# Patient Record
Sex: Female | Born: 1935 | Race: White | Hispanic: No | Smoking: Never smoker
Health system: Southern US, Community
[De-identification: ages and names within clinical notes are randomized; demographics above are authoritative.]

## PROBLEM LIST (undated history)

## (undated) DIAGNOSIS — E782 Mixed hyperlipidemia: Secondary | ICD-10-CM

## (undated) DIAGNOSIS — I10 Essential (primary) hypertension: Secondary | ICD-10-CM

## (undated) DIAGNOSIS — K862 Cyst of pancreas: Secondary | ICD-10-CM

## (undated) DIAGNOSIS — C4490 Unspecified malignant neoplasm of skin, unspecified: Secondary | ICD-10-CM

## (undated) DIAGNOSIS — J069 Acute upper respiratory infection, unspecified: Secondary | ICD-10-CM

## (undated) HISTORY — DX: Essential (primary) hypertension: I10

## (undated) HISTORY — DX: Mixed hyperlipidemia: E78.2

## (undated) HISTORY — DX: Acute upper respiratory infection, unspecified: J06.9

## (undated) HISTORY — DX: Unspecified malignant neoplasm of skin, unspecified: C44.90

## (undated) HISTORY — DX: Cyst of pancreas: K86.2

## (undated) HISTORY — PX: CHOLECYSTECTOMY: SHX55

## (undated) HISTORY — PX: SHOULDER SURGERY: SHX246

---

## 2015-05-26 DIAGNOSIS — J329 Chronic sinusitis, unspecified: Secondary | ICD-10-CM | POA: Diagnosis not present

## 2015-05-26 DIAGNOSIS — Z789 Other specified health status: Secondary | ICD-10-CM | POA: Diagnosis not present

## 2015-05-26 DIAGNOSIS — Z418 Encounter for other procedures for purposes other than remedying health state: Secondary | ICD-10-CM | POA: Diagnosis not present

## 2015-06-09 DIAGNOSIS — Z418 Encounter for other procedures for purposes other than remedying health state: Secondary | ICD-10-CM | POA: Diagnosis not present

## 2015-06-09 DIAGNOSIS — J329 Chronic sinusitis, unspecified: Secondary | ICD-10-CM | POA: Diagnosis not present

## 2015-06-09 DIAGNOSIS — Z789 Other specified health status: Secondary | ICD-10-CM | POA: Diagnosis not present

## 2015-08-10 DIAGNOSIS — H2513 Age-related nuclear cataract, bilateral: Secondary | ICD-10-CM | POA: Diagnosis not present

## 2015-08-10 DIAGNOSIS — H538 Other visual disturbances: Secondary | ICD-10-CM | POA: Diagnosis not present

## 2015-09-22 DIAGNOSIS — K2970 Gastritis, unspecified, without bleeding: Secondary | ICD-10-CM | POA: Diagnosis not present

## 2015-09-22 DIAGNOSIS — Z299 Encounter for prophylactic measures, unspecified: Secondary | ICD-10-CM | POA: Diagnosis not present

## 2015-11-11 DIAGNOSIS — R1011 Right upper quadrant pain: Secondary | ICD-10-CM | POA: Diagnosis not present

## 2015-11-11 DIAGNOSIS — Z299 Encounter for prophylactic measures, unspecified: Secondary | ICD-10-CM | POA: Diagnosis not present

## 2015-11-15 DIAGNOSIS — R1011 Right upper quadrant pain: Secondary | ICD-10-CM | POA: Diagnosis not present

## 2015-11-15 DIAGNOSIS — K8020 Calculus of gallbladder without cholecystitis without obstruction: Secondary | ICD-10-CM | POA: Diagnosis not present

## 2015-11-15 DIAGNOSIS — N289 Disorder of kidney and ureter, unspecified: Secondary | ICD-10-CM | POA: Diagnosis not present

## 2015-11-15 DIAGNOSIS — K760 Fatty (change of) liver, not elsewhere classified: Secondary | ICD-10-CM | POA: Diagnosis not present

## 2015-11-22 DIAGNOSIS — K8020 Calculus of gallbladder without cholecystitis without obstruction: Secondary | ICD-10-CM | POA: Diagnosis not present

## 2015-11-22 DIAGNOSIS — K862 Cyst of pancreas: Secondary | ICD-10-CM | POA: Diagnosis not present

## 2015-11-22 DIAGNOSIS — Z713 Dietary counseling and surveillance: Secondary | ICD-10-CM | POA: Diagnosis not present

## 2015-11-22 DIAGNOSIS — Z6825 Body mass index (BMI) 25.0-25.9, adult: Secondary | ICD-10-CM | POA: Diagnosis not present

## 2015-11-25 DIAGNOSIS — Z01812 Encounter for preprocedural laboratory examination: Secondary | ICD-10-CM | POA: Diagnosis not present

## 2015-11-25 DIAGNOSIS — R1011 Right upper quadrant pain: Secondary | ICD-10-CM | POA: Diagnosis not present

## 2015-11-26 DIAGNOSIS — R590 Localized enlarged lymph nodes: Secondary | ICD-10-CM | POA: Diagnosis not present

## 2015-11-26 DIAGNOSIS — K760 Fatty (change of) liver, not elsewhere classified: Secondary | ICD-10-CM | POA: Diagnosis not present

## 2015-11-26 DIAGNOSIS — Q453 Other congenital malformations of pancreas and pancreatic duct: Secondary | ICD-10-CM | POA: Diagnosis not present

## 2015-11-26 DIAGNOSIS — R935 Abnormal findings on diagnostic imaging of other abdominal regions, including retroperitoneum: Secondary | ICD-10-CM | POA: Diagnosis not present

## 2015-11-26 DIAGNOSIS — K8020 Calculus of gallbladder without cholecystitis without obstruction: Secondary | ICD-10-CM | POA: Diagnosis not present

## 2015-12-08 DIAGNOSIS — K8050 Calculus of bile duct without cholangitis or cholecystitis without obstruction: Secondary | ICD-10-CM | POA: Diagnosis not present

## 2015-12-09 DIAGNOSIS — K8050 Calculus of bile duct without cholangitis or cholecystitis without obstruction: Secondary | ICD-10-CM | POA: Diagnosis not present

## 2015-12-09 DIAGNOSIS — K8010 Calculus of gallbladder with chronic cholecystitis without obstruction: Secondary | ICD-10-CM | POA: Diagnosis not present

## 2016-03-07 DIAGNOSIS — Z23 Encounter for immunization: Secondary | ICD-10-CM | POA: Diagnosis not present

## 2016-03-31 DIAGNOSIS — Z79899 Other long term (current) drug therapy: Secondary | ICD-10-CM | POA: Diagnosis not present

## 2016-03-31 DIAGNOSIS — Z7189 Other specified counseling: Secondary | ICD-10-CM | POA: Diagnosis not present

## 2016-03-31 DIAGNOSIS — Z1389 Encounter for screening for other disorder: Secondary | ICD-10-CM | POA: Diagnosis not present

## 2016-03-31 DIAGNOSIS — Z299 Encounter for prophylactic measures, unspecified: Secondary | ICD-10-CM | POA: Diagnosis not present

## 2016-03-31 DIAGNOSIS — E559 Vitamin D deficiency, unspecified: Secondary | ICD-10-CM | POA: Diagnosis not present

## 2016-03-31 DIAGNOSIS — R5383 Other fatigue: Secondary | ICD-10-CM | POA: Diagnosis not present

## 2016-03-31 DIAGNOSIS — Z1211 Encounter for screening for malignant neoplasm of colon: Secondary | ICD-10-CM | POA: Diagnosis not present

## 2016-03-31 DIAGNOSIS — Z0000 Encounter for general adult medical examination without abnormal findings: Secondary | ICD-10-CM | POA: Diagnosis not present

## 2016-03-31 DIAGNOSIS — Z6825 Body mass index (BMI) 25.0-25.9, adult: Secondary | ICD-10-CM | POA: Diagnosis not present

## 2016-03-31 DIAGNOSIS — E7800 Pure hypercholesterolemia, unspecified: Secondary | ICD-10-CM | POA: Diagnosis not present

## 2017-01-03 DIAGNOSIS — E559 Vitamin D deficiency, unspecified: Secondary | ICD-10-CM | POA: Diagnosis not present

## 2017-01-03 DIAGNOSIS — Z6824 Body mass index (BMI) 24.0-24.9, adult: Secondary | ICD-10-CM | POA: Diagnosis not present

## 2017-01-03 DIAGNOSIS — J019 Acute sinusitis, unspecified: Secondary | ICD-10-CM | POA: Diagnosis not present

## 2017-01-03 DIAGNOSIS — Z789 Other specified health status: Secondary | ICD-10-CM | POA: Diagnosis not present

## 2017-01-03 DIAGNOSIS — Z299 Encounter for prophylactic measures, unspecified: Secondary | ICD-10-CM | POA: Diagnosis not present

## 2017-02-14 DIAGNOSIS — Z23 Encounter for immunization: Secondary | ICD-10-CM | POA: Diagnosis not present

## 2017-03-13 DIAGNOSIS — E663 Overweight: Secondary | ICD-10-CM | POA: Diagnosis not present

## 2017-03-13 DIAGNOSIS — E559 Vitamin D deficiency, unspecified: Secondary | ICD-10-CM | POA: Diagnosis not present

## 2017-03-13 DIAGNOSIS — Z789 Other specified health status: Secondary | ICD-10-CM | POA: Diagnosis not present

## 2017-03-13 DIAGNOSIS — Z299 Encounter for prophylactic measures, unspecified: Secondary | ICD-10-CM | POA: Diagnosis not present

## 2017-03-13 DIAGNOSIS — J069 Acute upper respiratory infection, unspecified: Secondary | ICD-10-CM | POA: Diagnosis not present

## 2017-04-04 DIAGNOSIS — Z5320 Procedure and treatment not carried out because of patient's decision for unspecified reasons: Secondary | ICD-10-CM | POA: Diagnosis not present

## 2017-04-04 DIAGNOSIS — Z1331 Encounter for screening for depression: Secondary | ICD-10-CM | POA: Diagnosis not present

## 2017-04-04 DIAGNOSIS — Z7189 Other specified counseling: Secondary | ICD-10-CM | POA: Diagnosis not present

## 2017-04-04 DIAGNOSIS — Z1211 Encounter for screening for malignant neoplasm of colon: Secondary | ICD-10-CM | POA: Diagnosis not present

## 2017-04-04 DIAGNOSIS — Z1339 Encounter for screening examination for other mental health and behavioral disorders: Secondary | ICD-10-CM | POA: Diagnosis not present

## 2017-04-04 DIAGNOSIS — Z0000 Encounter for general adult medical examination without abnormal findings: Secondary | ICD-10-CM | POA: Diagnosis not present

## 2017-04-04 DIAGNOSIS — Z299 Encounter for prophylactic measures, unspecified: Secondary | ICD-10-CM | POA: Diagnosis not present

## 2017-04-04 DIAGNOSIS — Z6825 Body mass index (BMI) 25.0-25.9, adult: Secondary | ICD-10-CM | POA: Diagnosis not present

## 2017-04-04 DIAGNOSIS — E559 Vitamin D deficiency, unspecified: Secondary | ICD-10-CM | POA: Diagnosis not present

## 2017-04-04 DIAGNOSIS — R5383 Other fatigue: Secondary | ICD-10-CM | POA: Diagnosis not present

## 2017-04-25 DIAGNOSIS — Z299 Encounter for prophylactic measures, unspecified: Secondary | ICD-10-CM | POA: Diagnosis not present

## 2017-04-25 DIAGNOSIS — R030 Elevated blood-pressure reading, without diagnosis of hypertension: Secondary | ICD-10-CM | POA: Diagnosis not present

## 2017-04-25 DIAGNOSIS — Z713 Dietary counseling and surveillance: Secondary | ICD-10-CM | POA: Diagnosis not present

## 2017-04-25 DIAGNOSIS — Z6825 Body mass index (BMI) 25.0-25.9, adult: Secondary | ICD-10-CM | POA: Diagnosis not present

## 2017-05-04 DIAGNOSIS — Z789 Other specified health status: Secondary | ICD-10-CM | POA: Diagnosis not present

## 2017-05-04 DIAGNOSIS — R030 Elevated blood-pressure reading, without diagnosis of hypertension: Secondary | ICD-10-CM | POA: Diagnosis not present

## 2017-05-04 DIAGNOSIS — Z713 Dietary counseling and surveillance: Secondary | ICD-10-CM | POA: Diagnosis not present

## 2017-05-04 DIAGNOSIS — Z299 Encounter for prophylactic measures, unspecified: Secondary | ICD-10-CM | POA: Diagnosis not present

## 2017-05-04 DIAGNOSIS — Z6825 Body mass index (BMI) 25.0-25.9, adult: Secondary | ICD-10-CM | POA: Diagnosis not present

## 2017-05-22 DIAGNOSIS — H33321 Round hole, right eye: Secondary | ICD-10-CM | POA: Diagnosis not present

## 2017-05-22 DIAGNOSIS — H5213 Myopia, bilateral: Secondary | ICD-10-CM | POA: Diagnosis not present

## 2017-05-22 DIAGNOSIS — H25813 Combined forms of age-related cataract, bilateral: Secondary | ICD-10-CM | POA: Diagnosis not present

## 2017-05-22 DIAGNOSIS — H52221 Regular astigmatism, right eye: Secondary | ICD-10-CM | POA: Diagnosis not present

## 2017-07-18 DIAGNOSIS — M1990 Unspecified osteoarthritis, unspecified site: Secondary | ICD-10-CM | POA: Diagnosis not present

## 2017-07-18 DIAGNOSIS — R509 Fever, unspecified: Secondary | ICD-10-CM | POA: Diagnosis not present

## 2017-07-18 DIAGNOSIS — R197 Diarrhea, unspecified: Secondary | ICD-10-CM | POA: Diagnosis not present

## 2017-07-18 DIAGNOSIS — J069 Acute upper respiratory infection, unspecified: Secondary | ICD-10-CM | POA: Diagnosis not present

## 2017-07-18 DIAGNOSIS — Z299 Encounter for prophylactic measures, unspecified: Secondary | ICD-10-CM | POA: Diagnosis not present

## 2017-07-18 DIAGNOSIS — J111 Influenza due to unidentified influenza virus with other respiratory manifestations: Secondary | ICD-10-CM | POA: Diagnosis not present

## 2017-07-18 DIAGNOSIS — R112 Nausea with vomiting, unspecified: Secondary | ICD-10-CM | POA: Diagnosis not present

## 2017-07-18 DIAGNOSIS — Z789 Other specified health status: Secondary | ICD-10-CM | POA: Diagnosis not present

## 2017-07-18 DIAGNOSIS — Z6825 Body mass index (BMI) 25.0-25.9, adult: Secondary | ICD-10-CM | POA: Diagnosis not present

## 2017-07-18 DIAGNOSIS — R0602 Shortness of breath: Secondary | ICD-10-CM | POA: Diagnosis not present

## 2017-09-18 DIAGNOSIS — Z789 Other specified health status: Secondary | ICD-10-CM | POA: Diagnosis not present

## 2017-09-18 DIAGNOSIS — Z713 Dietary counseling and surveillance: Secondary | ICD-10-CM | POA: Diagnosis not present

## 2017-09-18 DIAGNOSIS — Z6825 Body mass index (BMI) 25.0-25.9, adult: Secondary | ICD-10-CM | POA: Diagnosis not present

## 2017-09-18 DIAGNOSIS — J329 Chronic sinusitis, unspecified: Secondary | ICD-10-CM | POA: Diagnosis not present

## 2017-09-18 DIAGNOSIS — Z299 Encounter for prophylactic measures, unspecified: Secondary | ICD-10-CM | POA: Diagnosis not present

## 2017-12-03 DIAGNOSIS — Z299 Encounter for prophylactic measures, unspecified: Secondary | ICD-10-CM | POA: Diagnosis not present

## 2017-12-03 DIAGNOSIS — Z713 Dietary counseling and surveillance: Secondary | ICD-10-CM | POA: Diagnosis not present

## 2017-12-03 DIAGNOSIS — B351 Tinea unguium: Secondary | ICD-10-CM | POA: Diagnosis not present

## 2017-12-03 DIAGNOSIS — Z6825 Body mass index (BMI) 25.0-25.9, adult: Secondary | ICD-10-CM | POA: Diagnosis not present

## 2018-02-21 DIAGNOSIS — Z299 Encounter for prophylactic measures, unspecified: Secondary | ICD-10-CM | POA: Diagnosis not present

## 2018-02-21 DIAGNOSIS — Z713 Dietary counseling and surveillance: Secondary | ICD-10-CM | POA: Diagnosis not present

## 2018-02-21 DIAGNOSIS — J309 Allergic rhinitis, unspecified: Secondary | ICD-10-CM | POA: Diagnosis not present

## 2018-02-21 DIAGNOSIS — Z6826 Body mass index (BMI) 26.0-26.9, adult: Secondary | ICD-10-CM | POA: Diagnosis not present

## 2018-02-21 DIAGNOSIS — J069 Acute upper respiratory infection, unspecified: Secondary | ICD-10-CM | POA: Diagnosis not present

## 2018-02-25 DIAGNOSIS — Z23 Encounter for immunization: Secondary | ICD-10-CM | POA: Diagnosis not present

## 2018-04-10 DIAGNOSIS — Z6826 Body mass index (BMI) 26.0-26.9, adult: Secondary | ICD-10-CM | POA: Diagnosis not present

## 2018-04-10 DIAGNOSIS — R5383 Other fatigue: Secondary | ICD-10-CM | POA: Diagnosis not present

## 2018-04-10 DIAGNOSIS — Z0000 Encounter for general adult medical examination without abnormal findings: Secondary | ICD-10-CM | POA: Diagnosis not present

## 2018-04-10 DIAGNOSIS — E559 Vitamin D deficiency, unspecified: Secondary | ICD-10-CM | POA: Diagnosis not present

## 2018-04-10 DIAGNOSIS — Z299 Encounter for prophylactic measures, unspecified: Secondary | ICD-10-CM | POA: Diagnosis not present

## 2018-04-10 DIAGNOSIS — Z7189 Other specified counseling: Secondary | ICD-10-CM | POA: Diagnosis not present

## 2018-04-10 DIAGNOSIS — Z1331 Encounter for screening for depression: Secondary | ICD-10-CM | POA: Diagnosis not present

## 2018-04-10 DIAGNOSIS — Z1339 Encounter for screening examination for other mental health and behavioral disorders: Secondary | ICD-10-CM | POA: Diagnosis not present

## 2018-04-10 DIAGNOSIS — Z1211 Encounter for screening for malignant neoplasm of colon: Secondary | ICD-10-CM | POA: Diagnosis not present

## 2018-04-10 DIAGNOSIS — Z79899 Other long term (current) drug therapy: Secondary | ICD-10-CM | POA: Diagnosis not present

## 2019-02-13 DIAGNOSIS — Z23 Encounter for immunization: Secondary | ICD-10-CM | POA: Diagnosis not present

## 2019-03-17 DIAGNOSIS — H2513 Age-related nuclear cataract, bilateral: Secondary | ICD-10-CM | POA: Diagnosis not present

## 2019-03-17 DIAGNOSIS — H524 Presbyopia: Secondary | ICD-10-CM | POA: Diagnosis not present

## 2019-03-17 DIAGNOSIS — H52223 Regular astigmatism, bilateral: Secondary | ICD-10-CM | POA: Diagnosis not present

## 2019-03-17 DIAGNOSIS — H5213 Myopia, bilateral: Secondary | ICD-10-CM | POA: Diagnosis not present

## 2019-06-04 DIAGNOSIS — Z23 Encounter for immunization: Secondary | ICD-10-CM | POA: Diagnosis not present

## 2019-07-02 DIAGNOSIS — Z23 Encounter for immunization: Secondary | ICD-10-CM | POA: Diagnosis not present

## 2019-08-20 DIAGNOSIS — Z1211 Encounter for screening for malignant neoplasm of colon: Secondary | ICD-10-CM | POA: Diagnosis not present

## 2019-08-20 DIAGNOSIS — R5383 Other fatigue: Secondary | ICD-10-CM | POA: Diagnosis not present

## 2019-08-20 DIAGNOSIS — Z1331 Encounter for screening for depression: Secondary | ICD-10-CM | POA: Diagnosis not present

## 2019-08-20 DIAGNOSIS — Z7189 Other specified counseling: Secondary | ICD-10-CM | POA: Diagnosis not present

## 2019-08-20 DIAGNOSIS — Z1339 Encounter for screening examination for other mental health and behavioral disorders: Secondary | ICD-10-CM | POA: Diagnosis not present

## 2019-08-20 DIAGNOSIS — E559 Vitamin D deficiency, unspecified: Secondary | ICD-10-CM | POA: Diagnosis not present

## 2019-08-20 DIAGNOSIS — Z6828 Body mass index (BMI) 28.0-28.9, adult: Secondary | ICD-10-CM | POA: Diagnosis not present

## 2019-08-20 DIAGNOSIS — Z79899 Other long term (current) drug therapy: Secondary | ICD-10-CM | POA: Diagnosis not present

## 2019-08-20 DIAGNOSIS — Z0000 Encounter for general adult medical examination without abnormal findings: Secondary | ICD-10-CM | POA: Diagnosis not present

## 2019-08-20 DIAGNOSIS — Z299 Encounter for prophylactic measures, unspecified: Secondary | ICD-10-CM | POA: Diagnosis not present

## 2020-02-23 DIAGNOSIS — Z23 Encounter for immunization: Secondary | ICD-10-CM | POA: Diagnosis not present

## 2020-03-10 DIAGNOSIS — Z23 Encounter for immunization: Secondary | ICD-10-CM | POA: Diagnosis not present

## 2020-08-24 DIAGNOSIS — Z1331 Encounter for screening for depression: Secondary | ICD-10-CM | POA: Diagnosis not present

## 2020-08-24 DIAGNOSIS — Z1339 Encounter for screening examination for other mental health and behavioral disorders: Secondary | ICD-10-CM | POA: Diagnosis not present

## 2020-08-24 DIAGNOSIS — Z7189 Other specified counseling: Secondary | ICD-10-CM | POA: Diagnosis not present

## 2020-08-24 DIAGNOSIS — Z299 Encounter for prophylactic measures, unspecified: Secondary | ICD-10-CM | POA: Diagnosis not present

## 2020-08-24 DIAGNOSIS — Z79899 Other long term (current) drug therapy: Secondary | ICD-10-CM | POA: Diagnosis not present

## 2020-08-24 DIAGNOSIS — E559 Vitamin D deficiency, unspecified: Secondary | ICD-10-CM | POA: Diagnosis not present

## 2020-08-24 DIAGNOSIS — Z6826 Body mass index (BMI) 26.0-26.9, adult: Secondary | ICD-10-CM | POA: Diagnosis not present

## 2020-08-24 DIAGNOSIS — E7800 Pure hypercholesterolemia, unspecified: Secondary | ICD-10-CM | POA: Diagnosis not present

## 2020-08-24 DIAGNOSIS — Z0000 Encounter for general adult medical examination without abnormal findings: Secondary | ICD-10-CM | POA: Diagnosis not present

## 2020-08-24 DIAGNOSIS — R5383 Other fatigue: Secondary | ICD-10-CM | POA: Diagnosis not present

## 2020-09-06 DIAGNOSIS — Z299 Encounter for prophylactic measures, unspecified: Secondary | ICD-10-CM | POA: Diagnosis not present

## 2020-09-06 DIAGNOSIS — I10 Essential (primary) hypertension: Secondary | ICD-10-CM | POA: Diagnosis not present

## 2020-09-06 DIAGNOSIS — E039 Hypothyroidism, unspecified: Secondary | ICD-10-CM | POA: Diagnosis not present

## 2020-09-10 DIAGNOSIS — Z299 Encounter for prophylactic measures, unspecified: Secondary | ICD-10-CM | POA: Diagnosis not present

## 2020-09-10 DIAGNOSIS — J329 Chronic sinusitis, unspecified: Secondary | ICD-10-CM | POA: Diagnosis not present

## 2020-09-10 DIAGNOSIS — J310 Chronic rhinitis: Secondary | ICD-10-CM | POA: Diagnosis not present

## 2020-09-10 DIAGNOSIS — I10 Essential (primary) hypertension: Secondary | ICD-10-CM | POA: Diagnosis not present

## 2020-09-10 DIAGNOSIS — Z789 Other specified health status: Secondary | ICD-10-CM | POA: Diagnosis not present

## 2020-09-16 DIAGNOSIS — Z23 Encounter for immunization: Secondary | ICD-10-CM | POA: Diagnosis not present

## 2020-09-22 DIAGNOSIS — Z299 Encounter for prophylactic measures, unspecified: Secondary | ICD-10-CM | POA: Diagnosis not present

## 2020-09-22 DIAGNOSIS — I10 Essential (primary) hypertension: Secondary | ICD-10-CM | POA: Diagnosis not present

## 2020-09-22 DIAGNOSIS — E039 Hypothyroidism, unspecified: Secondary | ICD-10-CM | POA: Diagnosis not present

## 2020-10-12 DIAGNOSIS — I10 Essential (primary) hypertension: Secondary | ICD-10-CM | POA: Diagnosis not present

## 2020-10-20 DIAGNOSIS — E039 Hypothyroidism, unspecified: Secondary | ICD-10-CM | POA: Diagnosis not present

## 2020-10-26 DIAGNOSIS — Z789 Other specified health status: Secondary | ICD-10-CM | POA: Diagnosis not present

## 2020-10-26 DIAGNOSIS — Z299 Encounter for prophylactic measures, unspecified: Secondary | ICD-10-CM | POA: Diagnosis not present

## 2020-10-26 DIAGNOSIS — E039 Hypothyroidism, unspecified: Secondary | ICD-10-CM | POA: Diagnosis not present

## 2020-10-26 DIAGNOSIS — I10 Essential (primary) hypertension: Secondary | ICD-10-CM | POA: Diagnosis not present

## 2020-11-11 DIAGNOSIS — I10 Essential (primary) hypertension: Secondary | ICD-10-CM | POA: Diagnosis not present

## 2020-11-18 DIAGNOSIS — H524 Presbyopia: Secondary | ICD-10-CM | POA: Diagnosis not present

## 2020-11-18 DIAGNOSIS — H52223 Regular astigmatism, bilateral: Secondary | ICD-10-CM | POA: Diagnosis not present

## 2020-11-18 DIAGNOSIS — H25813 Combined forms of age-related cataract, bilateral: Secondary | ICD-10-CM | POA: Diagnosis not present

## 2020-11-18 DIAGNOSIS — D3131 Benign neoplasm of right choroid: Secondary | ICD-10-CM | POA: Diagnosis not present

## 2020-11-18 DIAGNOSIS — H5213 Myopia, bilateral: Secondary | ICD-10-CM | POA: Diagnosis not present

## 2020-11-23 DIAGNOSIS — E039 Hypothyroidism, unspecified: Secondary | ICD-10-CM | POA: Diagnosis not present

## 2020-11-23 DIAGNOSIS — Z789 Other specified health status: Secondary | ICD-10-CM | POA: Diagnosis not present

## 2020-11-23 DIAGNOSIS — I10 Essential (primary) hypertension: Secondary | ICD-10-CM | POA: Diagnosis not present

## 2020-11-23 DIAGNOSIS — Z299 Encounter for prophylactic measures, unspecified: Secondary | ICD-10-CM | POA: Diagnosis not present

## 2020-12-10 DIAGNOSIS — I10 Essential (primary) hypertension: Secondary | ICD-10-CM | POA: Diagnosis not present

## 2020-12-13 DIAGNOSIS — D485 Neoplasm of uncertain behavior of skin: Secondary | ICD-10-CM | POA: Diagnosis not present

## 2020-12-13 DIAGNOSIS — Z299 Encounter for prophylactic measures, unspecified: Secondary | ICD-10-CM | POA: Diagnosis not present

## 2020-12-13 DIAGNOSIS — C44321 Squamous cell carcinoma of skin of nose: Secondary | ICD-10-CM | POA: Diagnosis not present

## 2021-01-10 DIAGNOSIS — C44329 Squamous cell carcinoma of skin of other parts of face: Secondary | ICD-10-CM | POA: Diagnosis not present

## 2021-01-12 DIAGNOSIS — I10 Essential (primary) hypertension: Secondary | ICD-10-CM | POA: Diagnosis not present

## 2021-02-03 DIAGNOSIS — Z23 Encounter for immunization: Secondary | ICD-10-CM | POA: Diagnosis not present

## 2021-02-11 DIAGNOSIS — I10 Essential (primary) hypertension: Secondary | ICD-10-CM | POA: Diagnosis not present

## 2021-02-23 DIAGNOSIS — Z23 Encounter for immunization: Secondary | ICD-10-CM | POA: Diagnosis not present

## 2021-03-14 DIAGNOSIS — I10 Essential (primary) hypertension: Secondary | ICD-10-CM | POA: Diagnosis not present

## 2021-03-17 DIAGNOSIS — E039 Hypothyroidism, unspecified: Secondary | ICD-10-CM | POA: Diagnosis not present

## 2021-03-17 DIAGNOSIS — Z2821 Immunization not carried out because of patient refusal: Secondary | ICD-10-CM | POA: Diagnosis not present

## 2021-03-17 DIAGNOSIS — I10 Essential (primary) hypertension: Secondary | ICD-10-CM | POA: Diagnosis not present

## 2021-03-17 DIAGNOSIS — Z6825 Body mass index (BMI) 25.0-25.9, adult: Secondary | ICD-10-CM | POA: Diagnosis not present

## 2021-03-17 DIAGNOSIS — Z299 Encounter for prophylactic measures, unspecified: Secondary | ICD-10-CM | POA: Diagnosis not present

## 2021-04-13 DIAGNOSIS — I10 Essential (primary) hypertension: Secondary | ICD-10-CM | POA: Diagnosis not present

## 2021-08-16 ENCOUNTER — Emergency Department (HOSPITAL_COMMUNITY): Payer: Medicare Other

## 2021-08-16 ENCOUNTER — Observation Stay (HOSPITAL_COMMUNITY)
Admission: EM | Admit: 2021-08-16 | Discharge: 2021-08-17 | Disposition: A | Discharge status: Home / Self Care | Payer: Medicare Other | Attending: Family Medicine | Admitting: Family Medicine

## 2021-08-16 ENCOUNTER — Encounter (HOSPITAL_COMMUNITY): Payer: Self-pay

## 2021-08-16 ENCOUNTER — Other Ambulatory Visit: Payer: Self-pay

## 2021-08-16 DIAGNOSIS — R55 Syncope and collapse: Principal | ICD-10-CM | POA: Diagnosis present

## 2021-08-16 DIAGNOSIS — Z20822 Contact with and (suspected) exposure to covid-19: Secondary | ICD-10-CM | POA: Diagnosis not present

## 2021-08-16 DIAGNOSIS — R4182 Altered mental status, unspecified: Secondary | ICD-10-CM | POA: Diagnosis not present

## 2021-08-16 DIAGNOSIS — G4489 Other headache syndrome: Secondary | ICD-10-CM | POA: Diagnosis not present

## 2021-08-16 DIAGNOSIS — I10 Essential (primary) hypertension: Secondary | ICD-10-CM | POA: Diagnosis not present

## 2021-08-16 DIAGNOSIS — E039 Hypothyroidism, unspecified: Secondary | ICD-10-CM | POA: Diagnosis not present

## 2021-08-16 DIAGNOSIS — R4020 Unspecified coma: Secondary | ICD-10-CM | POA: Diagnosis not present

## 2021-08-16 DIAGNOSIS — R197 Diarrhea, unspecified: Secondary | ICD-10-CM | POA: Diagnosis not present

## 2021-08-16 HISTORY — DX: Essential (primary) hypertension: I10

## 2021-08-16 LAB — RESP PANEL BY RT-PCR (FLU A&B, COVID) ARPGX2
Influenza A by PCR: NEGATIVE
Influenza B by PCR: NEGATIVE
SARS Coronavirus 2 by RT PCR: NEGATIVE

## 2021-08-16 LAB — COMPREHENSIVE METABOLIC PANEL
ALT: 15 U/L (ref 0–44)
AST: 23 U/L (ref 15–41)
Albumin: 3.9 g/dL (ref 3.5–5.0)
Alkaline Phosphatase: 55 U/L (ref 38–126)
Anion gap: 8 (ref 5–15)
BUN: 22 mg/dL (ref 8–23)
CO2: 25 mmol/L (ref 22–32)
Calcium: 8.6 mg/dL — ABNORMAL LOW (ref 8.9–10.3)
Chloride: 102 mmol/L (ref 98–111)
Creatinine, Ser: 0.84 mg/dL (ref 0.44–1.00)
GFR, Estimated: >60 mL/min (ref >60)
Glucose, Bld: 123 mg/dL — ABNORMAL HIGH (ref 70–99)
Potassium: 4.3 mmol/L (ref 3.5–5.1)
Sodium: 135 mmol/L (ref 135–145)
Total Bilirubin: 0.8 mg/dL (ref 0.3–1.2)
Total Protein: 7.5 g/dL (ref 6.5–8.1)

## 2021-08-16 LAB — TROPONIN I (HIGH SENSITIVITY)
Troponin I (High Sensitivity): 4 ng/L (ref <18)
Troponin I (High Sensitivity): 5 ng/L (ref <18)

## 2021-08-16 LAB — CBC WITH DIFFERENTIAL/PLATELET
Abs Immature Granulocytes: 0.03 10*3/uL (ref 0.00–0.07)
Basophils Absolute: 0 10*3/uL (ref 0.0–0.1)
Basophils Relative: 0 %
Eosinophils Absolute: 0 10*3/uL (ref 0.0–0.5)
Eosinophils Relative: 0 %
HCT: 40.6 % (ref 36.0–46.0)
Hemoglobin: 13.2 g/dL (ref 12.0–15.0)
Immature Granulocytes: 0 %
Lymphocytes Relative: 5 %
Lymphs Abs: 0.6 10*3/uL — ABNORMAL LOW (ref 0.7–4.0)
MCH: 29.3 pg (ref 26.0–34.0)
MCHC: 32.5 g/dL (ref 30.0–36.0)
MCV: 90.2 fL (ref 80.0–100.0)
Monocytes Absolute: 0.3 10*3/uL (ref 0.1–1.0)
Monocytes Relative: 3 %
Neutro Abs: 9.8 10*3/uL — ABNORMAL HIGH (ref 1.7–7.7)
Neutrophils Relative %: 92 %
Platelets: 191 10*3/uL (ref 150–400)
RBC: 4.5 MIL/uL (ref 3.87–5.11)
RDW: 12.8 % (ref 11.5–15.5)
WBC: 10.7 10*3/uL — ABNORMAL HIGH (ref 4.0–10.5)
nRBC: 0 % (ref 0.0–0.2)

## 2021-08-16 LAB — MAGNESIUM: Magnesium: 1.9 mg/dL (ref 1.7–2.4)

## 2021-08-16 LAB — TSH: TSH: 0.861 u[IU]/mL (ref 0.350–4.500)

## 2021-08-16 LAB — LIPASE, BLOOD: Lipase: 38 U/L (ref 11–51)

## 2021-08-16 MED ORDER — ENOXAPARIN SODIUM 40 MG/0.4ML IJ SOSY
40.0000 mg | PREFILLED_SYRINGE | INTRAMUSCULAR | Status: DC
  Administered 2021-08-16: 40 mg via SUBCUTANEOUS
  Filled 2021-08-16: qty 0.4

## 2021-08-16 MED ORDER — ACETAMINOPHEN 650 MG RE SUPP: 650.0000 mg | Freq: Four times a day (QID) | RECTAL | Status: DC | PRN

## 2021-08-16 MED ORDER — ACETAMINOPHEN 325 MG PO TABS: 650.0000 mg | ORAL_TABLET | Freq: Four times a day (QID) | ORAL | Status: DC | PRN

## 2021-08-16 MED ORDER — POLYETHYLENE GLYCOL 3350 17 G PO PACK: 17.0000 g | PACK | Freq: Every day | ORAL | Status: DC | PRN

## 2021-08-16 MED ORDER — SODIUM CHLORIDE 0.9 % IV BOLUS
500.0000 mL | Freq: Once | INTRAVENOUS | Status: AC
  Administered 2021-08-16: 500 mL via INTRAVENOUS

## 2021-08-16 MED ORDER — SODIUM CHLORIDE 0.9 % IV SOLN: INTRAVENOUS | Status: DC

## 2021-08-16 NOTE — ED Provider Notes (Signed)
? ?Emergency Department Provider Note ? ? ?I have reviewed the triage vital signs and the nursing notes. ? ? ?HISTORY ? ?Chief Complaint ?Weakness ? ? ?HPI ?Alejandra Jimenez is a 87 y.o. female with PMH of HTN presents to the emergency room for evaluation of syncope.  Patient states that she is feeling well today and playing cards with friends when she suddenly developed tinnitus.  States she was not feeling well so she went to her bedroom to sit down but shortly afterward woke up face down on the floor.  She does not recall having chest pain, palpitations, shortness of breath.  She notes that she had had diarrhea on herself along with some vomiting.  She was found by staff and ultimately EMS called.  She states she now feeling well.  She is not having any residual symptoms.  No new medications. No prior episodes of syncope.  ? ? ?Past Medical History:  ?Diagnosis Date  ? Hypertension   ? ? ?Review of Systems ? ?Constitutional: No fever/chills ?Eyes: No visual changes. ?ENT: No sore throat. Tinnitus (resolved).  ?Cardiovascular: Denies chest pain. Positive syncope.  ?Respiratory: Denies shortness of breath. ?Gastrointestinal: No abdominal pain.  No nausea, no vomiting.  No diarrhea.  No constipation. ?Genitourinary: Negative for dysuria. ?Musculoskeletal: Negative for back pain. ?Skin: Negative for rash. ?Neurological: Negative for headaches, focal weakness or numbness. ? ? ?____________________________________________ ? ? ?PHYSICAL EXAM: ? ?VITAL SIGNS: ?ED Triage Vitals  ?Enc Vitals Group  ?   BP 08/16/21 1617 (!) 151/80  ?   Pulse Rate 08/16/21 1617 85  ?   Resp 08/16/21 1617 18  ?   Temp 08/16/21 1617 97.8 ?F (36.6 ?C)  ?   Temp Source 08/16/21 1617 Oral  ?   SpO2 08/16/21 1617 92 %  ?   Weight 08/16/21 1612 152 lb (68.9 kg)  ?   Height 08/16/21 1612 '5\' 8"'$  (1.727 m)  ? ?Constitutional: Alert and oriented. Well appearing and in no acute distress. ?Eyes: Conjunctivae are normal.  ?Head: Atraumatic. ?Nose: No  congestion/rhinnorhea. ?Mouth/Throat: Mucous membranes are moist.  Oropharynx non-erythematous. ?Neck: No stridor.   ?Cardiovascular: Normal rate, regular rhythm. Good peripheral circulation. Grossly normal heart sounds.   ?Respiratory: Normal respiratory effort.  No retractions. Lungs CTAB. ?Gastrointestinal: Soft and nontender. No distention.  ?Musculoskeletal: No lower extremity tenderness nor edema. No gross deformities of extremities. ?Neurologic:  Normal speech and language. No gross focal neurologic deficits are appreciated.  ?Skin:  Skin is warm, dry and intact. No rash noted. ? ? ?____________________________________________ ?  ?LABS ?(all labs ordered are listed, but only abnormal results are displayed) ? ?Labs Reviewed  ?COMPREHENSIVE METABOLIC PANEL - Abnormal; Notable for the following components:  ?    Result Value  ? Glucose, Bld 123 (*)   ? Calcium 8.6 (*)   ? All other components within normal limits  ?CBC WITH DIFFERENTIAL/PLATELET - Abnormal; Notable for the following components:  ? WBC 10.7 (*)   ? Neutro Abs 9.8 (*)   ? Lymphs Abs 0.6 (*)   ? All other components within normal limits  ?RESP PANEL BY RT-PCR (FLU A&B, COVID) [id number]  ?LIPASE, BLOOD  ?TSH  ?TROPONIN I (HIGH SENSITIVITY)  ?TROPONIN I (HIGH SENSITIVITY)  ? ?____________________________________________ ? ?EKG ? ?Triage EKG:  ? ?Rate: 90 ?PR: 148 ?QTc: 442 ? ?Sinus rhythm. Narrow QRS. Nonsecific ST changes. No STEMI.  ?____________________________________________ ? ?RADIOLOGY ? ?DG Chest 2 View ? ?Result Date: 08/16/2021 ?CLINICAL DATA:  Syncope EXAM: CHEST -  2 VIEW COMPARISON:  July 18, 2017 FINDINGS: The heart size and mediastinal contours are within normal limits. Aortic atherosclerosis. Biapical pleural thickening similar prior. Chronic senescent lung changes. No focal airspace consolidation or overt pulmonary edema. No pleural effusion. No pneumothorax. No acute osseous abnormality. IMPRESSION: 1. No acute cardiopulmonary findings.  2.  Aortic Atherosclerosis (ICD10-I70.0). Electronically Signed   By: Dahlia Bailiff M.D.   On: 08/16/2021 17:59  ? ?CT HEAD WO CONTRAST (5MM) ? ?Result Date: 08/16/2021 ?CLINICAL DATA:  Mental status change. EXAM: CT HEAD WITHOUT CONTRAST TECHNIQUE: Contiguous axial images were obtained from the base of the skull through the vertex without intravenous contrast. RADIATION DOSE REDUCTION: This exam was performed according to the departmental dose-optimization program which includes automated exposure control, adjustment of the mA and/or kV according to patient size and/or use of iterative reconstruction technique. COMPARISON:  None. FINDINGS: Brain: No acute intracranial hemorrhage. No focal mass lesion. No CT evidence of acute infarction. No midline shift or mass effect. No hydrocephalus. Basilar cisterns are patent. There are mild periventricular and subcortical white matter hypodensities. Generalized cortical atrophy. Vascular: No hyperdense vessel or unexpected calcification. Skull: Normal. Negative for fracture or focal lesion. Sinuses/Orbits: Paranasal sinuses and mastoid air cells are clear. Orbits are clear. Other: None. IMPRESSION: 1. No acute intracranial findings. 2. Mild generalized atrophy and white matter microvascular disease. Electronically Signed   By: Suzy Bouchard M.D.   On: 08/16/2021 18:21   ? ?____________________________________________ ? ? ?PROCEDURES ? ?Procedure(s) performed:  ? ?Procedures ? ?None  ?____________________________________________ ? ? ?INITIAL IMPRESSION / ASSESSMENT AND PLAN / ED COURSE ? ?Pertinent labs & imaging results that were available during my care of the patient were reviewed by me and considered in my medical decision making (see chart for details). ?  ?This patient is Presenting for Evaluation of syncope, which does require a range of treatment options, and is a complaint that involves a high risk of morbidity and mortality. ? ?The Differential Diagnoses include  vasovagal syncope, cardiogenic syncope, dehydration, seizure. ? ?Critical Interventions-  ?  ?Medications  ?sodium chloride 0.9 % bolus 500 mL (0 mLs Intravenous Stopped 08/16/21 2041)  ? ? ?Reassessment after intervention: Patient continues to feel well.  ? ? ?I did obtain Additional Historical Information from EMS. No vital sign abnormality en route.  ? ?I decided to review pertinent External Data, and in summary no prior ED evaluation for syncope. ?  ?Clinical Laboratory Tests Ordered, included COVID and flu PCR negative.  Troponin is 5.  TSH is within normal limits.  No acute kidney injury.  Mild leukocytosis at 10.7 with no anemia.  Lipase is normal. ? ?Radiologic Tests Ordered, included CT head and CXR. I independently interpreted the images and agree with radiology interpretation.  ? ?Cardiac Monitor Tracing which shows NSR. No ectopy.  ? ? ?Social Determinants of Health Risk No EtOH or smoking.  ? ?Consult complete with Hospitalist. Plan for admit.  ? ?Medical Decision Making: Summary:  ?Patient presents to the emergency department with syncope.  No clear prodrome other than tinnitus.  No active tinnitus or other neuro findings to suspect stroke.  No stigmata of seizure.  Imaging and chest x-ray interpreted as above.  No prior episodes similar to this.  Plan for admit for further monitoring and syncope evaluation. ? ?Reevaluation with update and discussion with patient and grandson by phone. They are in agreement with plan for admit.  ? ?Disposition: admit ? ?____________________________________________ ? ?FINAL CLINICAL IMPRESSION(S) / ED DIAGNOSES ? ?  Final diagnoses:  ?Syncope, unspecified syncope type  ? ? ? ? ?Note:  This document was prepared using Dragon voice recognition software and may include unintentional dictation errors. ? ?Nanda Quinton, MD, FACEP ?Emergency Medicine ? ?  ?Margette Fast, MD ?08/16/21 2107 ? ?

## 2021-08-16 NOTE — Assessment & Plan Note (Addendum)
TSH 0.8-borderline low. ?- she is on synthroid- '50mg'$  daily ?

## 2021-08-16 NOTE — H&P (Signed)
?History and Physical  ? ? ?Darrick Grinder DTO:[id number] DOB: 11-27-1934 DOA: 08/16/2021 ? ?PCP: Nicanor Bake C  ? ?Patient coming from: Home ? ?I have personally briefly reviewed patient's old medical records in [place] ? ?Chief Complaint: Syncope ? ?HPI: Alejandra Jimenez is a 87 y.o. female with medical history significant for hypertension. ?Patient was brought to the ED via EMS reports that she passed out today. ?At the time of my evaluation patient is awake alert oriented and able to give a detailed history.  Patient tells me patient was playing cards with her 3 friends when she suddenly started having ringing in the ears, just prior to this she had gotten up to make a phone call, she reports feeling weird/different may be lightheaded-she is not sure.  She told her friend she was not feeling quite okay and went to her room.  She went to sit down and this was the last she remembers before waking up to see her friend standing by her . ?Shortly after she went to her bedroom, her friends heard a sound -she fell, and rushed to the bedroom.  They found her face down in her vomit, she had also had a bowel movement.  She does not think she had had urinary continues also.  Patient tells me it was less than a minute between her getting to her room and her friend finding her.  No reports of jerking of extremities. ?EMS was called.  Patient also has a wrist watch that alerts family when she falls and so her grandson who works at [employer] also called. ?Patient lives alone, she drives, she is independent of all ADLs.  She tells me her only healthcare encounters has been for gallbladder surgery and skin cancer removed from her face.  She was started on lisinopril 5 mg and perhaps Synthroid 50 mg - 08/2020. ? ?Patient denies any palpitation, she denies prior lightheadedness, no chest pain, no difficulty breathing.  She has had good oral intake, no vomiting no loose stools.  No history of seizures.  No  headaches.  She ambulates without assistance or assistive devices. ? ?ED Course: Temperature 97.8.  Heart rate 85 - 99.  Respiratory 12-27.  Blood pressure systolic 983 to 382N.  Positive orthostatic vitals with systolic dropping from 053-976-BHA patient was asymptomatic. ?Troponin 4.  Unremarkable BMP, CBC. ?500 mill bolus given.  Hospitalist to admit. ? ?Review of Systems: As per HPI all other systems reviewed and negative. ? ?Past Medical History:  ?Diagnosis Date  ? Hypertension   ? ? ?Past Surgical History:  ?Procedure Laterality Date  ? CHOLECYSTECTOMY    ? ? ? reports that she has never smoked. She has never used smokeless tobacco. She reports that she does not drink alcohol and does not use drugs. ? ?Allergies  ?Allergen Reactions  ? Tamiflu [Oseltamivir] Nausea And Vomiting  ? ? ?No family history of seizures. ? ?Prior to Admission medications   ?Not on File  ? ? ?Physical Exam: ?Vitals:  ? 08/16/21 1830 08/16/21 1930 08/16/21 2000 08/16/21 2030  ?BP: (!) 160/63 (!) 160/71 (!) 162/82 (!) 138/100  ?Pulse: 92     ?Resp: 18 (!) 27 18 (!) 23  ?Temp:      ?TempSrc:      ?SpO2: 94%     ?Weight:      ?Height:      ? ? ?Constitutional: NAD, calm, comfortable ?Vitals:  ? 08/16/21 1830 08/16/21 1930 08/16/21 2000 08/16/21 2030  ?BP: Marland Kitchen)  160/63 (!) 160/71 (!) 162/82 (!) 138/100  ?Pulse: 92     ?Resp: 18 (!) 27 18 (!) 23  ?Temp:      ?TempSrc:      ?SpO2: 94%     ?Weight:      ?Height:      ? ?Eyes: PERRL, lids and conjunctivae normal ?ENMT: Mucous membranes are mildly dry ?Neck: normal, supple, no masses, no thyromegaly ?Respiratory: clear to auscultation bilaterally, no wheezing, no crackles. Normal respiratory effort. No accessory muscle use.  ?Cardiovascular: Regular rate and rhythm, no murmurs / rubs / gallops. No extremity edema. ?Abdomen: no tenderness, no masses palpated. No hepatosplenomegaly. Bowel sounds positive.  ?Musculoskeletal: no clubbing / cyanosis. No joint deformity upper and lower extremities. Good  ROM, no contractures. Normal muscle tone.  ?Skin: no rashes, lesions, ulcers. No induration ?Neurologic: No apparent cranial nerve abnormality, moving all extremities spontaneously.  ?Psychiatric: Normal judgment and insight. Alert and oriented x 3. Normal mood.  ? ?Labs on Admission: I have personally reviewed following labs and imaging studies ? ?CBC: ?Recent Labs  ?Lab 08/16/21 ?1723  ?WBC 10.7*  ?NEUTROABS 9.8*  ?HGB 13.2  ?HCT 40.6  ?MCV 90.2  ?PLT 191  ? ?Basic Metabolic Panel: ?Recent Labs  ?Lab 08/16/21 ?1723  ?NA 135  ?K 4.3  ?CL 102  ?CO2 25  ?GLUCOSE 123*  ?BUN 22  ?CREATININE 0.84  ?CALCIUM 8.6*  ? ?GFR: ?Estimated Creatinine Clearance: 50.3 mL/min (by C-G formula based on SCr of 0.84 mg/dL). ?Liver Function Tests: ?Recent Labs  ?Lab 08/16/21 ?1723  ?AST 23  ?ALT 15  ?ALKPHOS 55  ?BILITOT 0.8  ?PROT 7.5  ?ALBUMIN 3.9  ? ?Recent Labs  ?Lab 08/16/21 ?1723  ?LIPASE 38  ? ?Thyroid Function Tests: ?Recent Labs  ?  08/16/21 ?1723  ?TSH 0.861  ? ?Anemia Panel: ?No results for input(s): VITAMINB12, FOLATE, FERRITIN, TIBC, IRON, RETICCTPCT in the last 72 hours. ?Urine analysis: ?No results found for: COLORURINE, APPEARANCEUR, [place], [place], [place], [place], [place], KETONESUR, PROTEINUR, [place], NITRITE, LEUKOCYTESUR ? ?Radiological Exams on Admission: ?DG Chest 2 View ? ?Result Date: 08/16/2021 ?CLINICAL DATA:  Syncope EXAM: CHEST - 2 VIEW COMPARISON:  July 18, 2017 FINDINGS: The heart size and mediastinal contours are within normal limits. Aortic atherosclerosis. Biapical pleural thickening similar prior. Chronic senescent lung changes. No focal airspace consolidation or overt pulmonary edema. No pleural effusion. No pneumothorax. No acute osseous abnormality. IMPRESSION: 1. No acute cardiopulmonary findings. 2.  Aortic Atherosclerosis (ICD10-I70.0). Electronically Signed   By: Dahlia Bailiff M.D.   On: 08/16/2021 17:59  ? ?CT HEAD WO CONTRAST (5MM) ? ?Result Date: 08/16/2021 ?CLINICAL DATA:  Mental  status change. EXAM: CT HEAD WITHOUT CONTRAST TECHNIQUE: Contiguous axial images were obtained from the base of the skull through the vertex without intravenous contrast. RADIATION DOSE REDUCTION: This exam was performed according to the departmental dose-optimization program which includes automated exposure control, adjustment of the mA and/or kV according to patient size and/or use of iterative reconstruction technique. COMPARISON:  None. FINDINGS: Brain: No acute intracranial hemorrhage. No focal mass lesion. No CT evidence of acute infarction. No midline shift or mass effect. No hydrocephalus. Basilar cisterns are patent. There are mild periventricular and subcortical white matter hypodensities. Generalized cortical atrophy. Vascular: No hyperdense vessel or unexpected calcification. Skull: Normal. Negative for fracture or focal lesion. Sinuses/Orbits: Paranasal sinuses and mastoid air cells are clear. Orbits are clear. Other: None. IMPRESSION: 1. No acute intracranial findings. 2. Mild generalized atrophy and white matter microvascular  disease. Electronically Signed   By: Suzy Bouchard M.D.   On: 08/16/2021 18:21   ? ?EKG: Independently reviewed.  Sinus rhythm rate 90, QTc 442.  No prior EKG to compare.  Nonspecific T wave abnormalities in lateral leads.  QTc 442. (Unable to export EKG into epic,) ? ? ? ?Assessment/Plan ?Principal Problem: ?  Syncope ?Active Problems: ?  Hypothyroidism ?  Essential hypertension ? ?Assessment and Plan: ?* Syncope ?Syncopal episode with vomiting, and fecal incontinence.  No jerking of extremities.  No history of seizures.  Healthy 87 year old female.  CT without acute abnormality.  Two-view chest x-ray unremarkable.  EKG-sinus rhythm, without significant abnormalities. ?- Troponin 4 >> 5.  ?-Echocardiogram ?-Monitor on telemetry ?-Positive orthostatic vitals, systolic dropping from 767-209.  May be etiology contributed to syncope, but with vomiting and fecal incontinence may  need further work-up ending clinical course. ?-Obtain EEG ?-500 mill bolus given, continue N/s 75cc/hr x 20hrs ? ?Essential hypertension ?Positive orthostatic vitals. ?-Hold lisinopril for now ? ?Hypothyroidi

## 2021-08-16 NOTE — Assessment & Plan Note (Signed)
Positive orthostatic vitals. ?-Hold lisinopril for now ?

## 2021-08-16 NOTE — ED Notes (Signed)
Patient transported to CT and X ray 

## 2021-08-16 NOTE — Assessment & Plan Note (Addendum)
Syncopal episode with vomiting, and fecal incontinence.  No jerking of extremities.  No history of seizures.  Healthy 87 year old female.  CT without acute abnormality.  Two-view chest x-ray unremarkable.  EKG-sinus rhythm, without significant abnormalities. ?- Troponin 4 >> 5.  ?-Echocardiogram ?-Monitor on telemetry ?-Positive orthostatic vitals, systolic dropping from 619-012.  May be etiology contributed to syncope, but with vomiting and fecal incontinence may need further work-up ending clinical course. ?-Obtain EEG ?-500 mill bolus given, continue N/s 75cc/hr x 20hrs ?

## 2021-08-16 NOTE — ED Triage Notes (Addendum)
Patient arrived via EMS with complaints of syncopal episode. Per EMS patient was soiled with stool, vomit, and urine upon awakening from syncopal episode. Per EMS patient was face down in vomit upon awakening. Patient states she was playing cards, began to have ringing in her ears, and went to her room to sit down before the syncopal episode. Denies blood thinner. Patient A/O x4 in triage process.  ?

## 2021-08-17 ENCOUNTER — Observation Stay (HOSPITAL_BASED_OUTPATIENT_CLINIC_OR_DEPARTMENT_OTHER): Payer: Medicare Other

## 2021-08-17 ENCOUNTER — Encounter (HOSPITAL_COMMUNITY): Payer: Self-pay | Admitting: Internal Medicine

## 2021-08-17 DIAGNOSIS — R55 Syncope and collapse: Secondary | ICD-10-CM | POA: Diagnosis not present

## 2021-08-17 LAB — BASIC METABOLIC PANEL
Anion gap: 7 (ref 5–15)
BUN: 20 mg/dL (ref 8–23)
CO2: 25 mmol/L (ref 22–32)
Calcium: 8.3 mg/dL — ABNORMAL LOW (ref 8.9–10.3)
Chloride: 104 mmol/L (ref 98–111)
Creatinine, Ser: 0.95 mg/dL (ref 0.44–1.00)
GFR, Estimated: 59 mL/min — ABNORMAL LOW (ref >60)
Glucose, Bld: 89 mg/dL (ref 70–99)
Potassium: 4 mmol/L (ref 3.5–5.1)
Sodium: 136 mmol/L (ref 135–145)

## 2021-08-17 LAB — ECHOCARDIOGRAM COMPLETE
AR max vel: 2.06 cm2
AV Area VTI: 2.16 cm2
AV Area mean vel: 2.06 cm2
AV Mean grad: 4 mmHg
AV Peak grad: 6.7 mmHg
Ao pk vel: 1.29 m/s
Area-P 1/2: 3.58 cm2
Calc EF: 65.1 %
Height: 68 in
MV VTI: 2.4 cm2
S' Lateral: 2.5 cm
Single Plane A2C EF: 57.8 %
Single Plane A4C EF: 69.5 %
Weight: 2416 oz

## 2021-08-17 LAB — CBC
HCT: 32.8 % — ABNORMAL LOW (ref 36.0–46.0)
Hemoglobin: 10.8 g/dL — ABNORMAL LOW (ref 12.0–15.0)
MCH: 29.3 pg (ref 26.0–34.0)
MCHC: 32.9 g/dL (ref 30.0–36.0)
MCV: 89.1 fL (ref 80.0–100.0)
Platelets: 162 10*3/uL (ref 150–400)
RBC: 3.68 MIL/uL — ABNORMAL LOW (ref 3.87–5.11)
RDW: 12.8 % (ref 11.5–15.5)
WBC: 6.4 10*3/uL (ref 4.0–10.5)
nRBC: 0 % (ref 0.0–0.2)

## 2021-08-17 LAB — HEMOGLOBIN AND HEMATOCRIT, BLOOD
HCT: 33.3 % — ABNORMAL LOW (ref 36.0–46.0)
Hemoglobin: 11.2 g/dL — ABNORMAL LOW (ref 12.0–15.0)

## 2021-08-17 MED ORDER — LISINOPRIL 5 MG PO TABS: 5.0000 mg | ORAL_TABLET | Freq: Every day | ORAL | 3 refills | Status: DC

## 2021-08-17 MED ORDER — LEVOTHYROXINE SODIUM 50 MCG PO TABS: 50.0000 ug | ORAL_TABLET | Freq: Every day | ORAL | 3 refills | Status: AC

## 2021-08-17 NOTE — Discharge Summary (Signed)
?                                                                                ? ? ?Alejandra Jimenez, is a 87 y.o. female  DOB Jan 28, 1935  MRN [id number]. ? ?Admission date:  08/16/2021  Admitting Physician  Ejiroghene Arlyce Dice, MD ? ?Discharge Date:  08/17/2021  ? ?Primary MD  Nicanor Bake C ? ?Recommendations for primary care physician for things to follow:  ? ?1) You will repeat CBC and BMP blood test with the primary care physician in about 1 week or so ?2)You will need an MRI of the brain and EEG test if you have another episode of fainting/passing out in the near future ?3) the cardiology office will set up a heart monitor/Zio patch for you--to help look for any possible irregular heartbeat that may have contributed to your passing out episode ?4) please follow-up with Primary care physician Dr Nicanor Bake C  within a week for recheck and reevaluation ? ?Admission Diagnosis  Syncope [R55] ?Syncope, unspecified syncope type [R55] ? ? ?Discharge Diagnosis  Syncope [R55] ?Syncope, unspecified syncope type [R55]   ? ?Principal Problem: ?  Syncope ?Active Problems: ?  Hypothyroidism ?  Essential hypertension ?    ? ?Past Medical History:  ?Diagnosis Date  ? Hypertension   ? ? ?Past Surgical History:  ?Procedure Laterality Date  ? CHOLECYSTECTOMY    ? ? ? ? ? HPI  from the history and physical done on the day of admission:  ? ?HPI: Alejandra Jimenez is a 87 y.o. female with medical history significant for hypertension. ?Patient was brought to the ED via EMS reports that she passed out today. ?At the time of my evaluation patient is awake alert oriented and able to give a detailed history.  Patient tells me patient was playing cards with her 3 friends when she suddenly started having ringing in the ears, just prior to this she had gotten up to make a phone call, she reports feeling weird/different may be lightheaded-she is not sure.  She told her friend she was not feeling quite okay and went to her room.  She  went to sit down and this was the last she remembers before waking up to see her friend standing by her . ?Shortly after she went to her bedroom, her friends heard a sound -she fell, and rushed to the bedroom.  They found her face down in her vomit, she had also had a bowel movement.  She does not think she had had urinary continues also.  Patient tells me it was less than a minute between her getting to her room and her friend finding her.  No reports of jerking of extremities. ?EMS was called.  Patient also has a wrist watch that alerts family when she falls and so her grandson who works at [employer] also called. ?Patient lives alone, she drives, she is independent of all ADLs.  She tells me her only healthcare encounters has been for gallbladder surgery and skin cancer removed from her face.  She was started on lisinopril 5 mg and perhaps Synthroid 50 mg - 08/2020. ?  ?Patient denies any palpitation,  she denies prior lightheadedness, no chest pain, no difficulty breathing.  She has had good oral intake, no vomiting no loose stools.  No history of seizures.  No headaches.  She ambulates without assistance or assistive devices. ?  ?ED Course: Temperature 97.8.  Heart rate 85 - 99.  Respiratory 12-27.  Blood pressure systolic 381 to 829H.  Positive orthostatic vitals with systolic dropping from 371-696-VEL patient was asymptomatic. ?Troponin 4.  Unremarkable BMP, CBC. ?500 mill bolus given.  Hospitalist to admit. ?  ?Review of Systems: As per HPI all other systems reviewed and negative. ? ? ? ? Hospital Course:  ? ? Assessment and Plan: ?* Syncope ?Syncopal episode with vomiting, and fecal incontinence.  No jerking of extremities.  No history of seizures.  Otherwise healthy 87 year old female.  CT without acute abnormality.  Two-view chest x-ray unremarkable.  EKG-sinus rhythm, without significant abnormalities. ?- Troponin 4 >> 5.  ?-Echocardiogram with EF of 65 to 70% with grade 1 diastolic dysfunction  with pulmonary hypertension, echo without aortic stenosis ?-Patient was orthostatic, received IV fluids--BP improved ?-Outpatient MRI of the brain and EEG if another syncopal episode or concerns about seizures ?-Outpatient Holter/Zio patch--to be arranged through cardiology office ? ?Essential hypertension ?-Stable BP okay to resume lisinopril 5 mg daily ?-Follow-up with PCP for recheck and reeval ? ?Hypothyroidism ?TSH 0.8-borderline low. ?- she is on synthroid- '50mg'$  daily ? ?Discharge Condition: stable ? ?Follow UP--PCP for recheck and reevaluation, patient also needs outpatient Zio patch ?  ?Diet and Activity recommendation:  As advised ? ?Discharge Instructions   ?* ?Discharge Instructions   ? ? Call MD for:  difficulty breathing, headache or visual disturbances   Complete by: As directed ?  ? Call MD for:  persistant dizziness or light-headedness   Complete by: As directed ?  ? Call MD for:  persistant nausea and vomiting   Complete by: As directed ?  ? Call MD for:  temperature >100.4   Complete by: As directed ?  ? Diet - low sodium heart healthy   Complete by: As directed ?  ? Discharge instructions   Complete by: As directed ?  ? 1) You will repeat CBC and BMP blood test with the primary care physician in about 1 week or so ?2)You will need an MRI of the brain and EEG test if you have another episode of fainting/passing out in the near future ?3) the cardiology office will set up a heart monitor/Zio patch for you--to help look for any possible irregular heartbeat that may have contributed to your passing out episode ?4) please follow-up with Primary care physician Dr Nicanor Bake C  within a week for recheck and reevaluation  ? Increase activity slowly   Complete by: As directed ?  ? ?  ? ? ? ? Discharge Medications  ? ?  ?Allergies as of 08/17/2021   ? ?   Reactions  ? Tamiflu [oseltamivir] Nausea And Vomiting  ? ?  ? ?  ?Medication List  ?  ? ?TAKE these medications   ? ?levothyroxine 50 MCG  tablet ?Commonly known as: SYNTHROID ?Take 1 tablet (50 mcg total) by mouth daily before breakfast. ?What changed: when to take this ?  ?lisinopril 5 MG tablet ?Commonly known as: ZESTRIL ?Take 1 tablet (5 mg total) by mouth daily. ?  ? ?  ? ? ?Major procedures and Radiology Reports - PLEASE review detailed and final reports for all details, in brief -  ? ?DG Chest 2  View ? ?Result Date: 08/16/2021 ?CLINICAL DATA:  Syncope EXAM: CHEST - 2 VIEW COMPARISON:  July 18, 2017 FINDINGS: The heart size and mediastinal contours are within normal limits. Aortic atherosclerosis. Biapical pleural thickening similar prior. Chronic senescent lung changes. No focal airspace consolidation or overt pulmonary edema. No pleural effusion. No pneumothorax. No acute osseous abnormality. IMPRESSION: 1. No acute cardiopulmonary findings. 2.  Aortic Atherosclerosis (ICD10-I70.0). Electronically Signed   By: Dahlia Bailiff M.D.   On: 08/16/2021 17:59  ? ?CT HEAD WO CONTRAST (5MM) ? ?Result Date: 08/16/2021 ?CLINICAL DATA:  Mental status change. EXAM: CT HEAD WITHOUT CONTRAST TECHNIQUE: Contiguous axial images were obtained from the base of the skull through the vertex without intravenous contrast. RADIATION DOSE REDUCTION: This exam was performed according to the departmental dose-optimization program which includes automated exposure control, adjustment of the mA and/or kV according to patient size and/or use of iterative reconstruction technique. COMPARISON:  None. FINDINGS: Brain: No acute intracranial hemorrhage. No focal mass lesion. No CT evidence of acute infarction. No midline shift or mass effect. No hydrocephalus. Basilar cisterns are patent. There are mild periventricular and subcortical white matter hypodensities. Generalized cortical atrophy. Vascular: No hyperdense vessel or unexpected calcification. Skull: Normal. Negative for fracture or focal lesion. Sinuses/Orbits: Paranasal sinuses and mastoid air cells are clear. Orbits are  clear. Other: None. IMPRESSION: 1. No acute intracranial findings. 2. Mild generalized atrophy and white matter microvascular disease. Electronically Signed   By: Suzy Bouchard M.D.   On: 08/16/2021 18:21  ? ?ECHO

## 2021-08-17 NOTE — Discharge Instructions (Signed)
1) You will repeat CBC and BMP blood test with the primary care physician in about 1 week or so ?2)You will need an MRI of the brain and EEG test if you have another episode of fainting/passing out in the near future ?3) the cardiology office will set up a heart monitor/Zio patch for you--to help look for any possible irregular heartbeat that may have contributed to your passing out episode ?4) please follow-up with Primary care physician Dr Nicanor Bake C  within a week for recheck and reevaluation ?

## 2021-08-17 NOTE — Progress Notes (Signed)
?  Transition of Care (TOC) Screening Note ? ? ?Patient Details  ?Name: Alejandra Jimenez ?Date of Birth: Oct 08, 1934 ? ? ?Transition of Care (TOC) CM/SW Contact:    ?Iona Beard, LCSWA ?Phone Number: ?08/17/2021, 11:15 AM ? ? ? ?Transition of Care Department [facility]) has reviewed patient and no TOC needs have been identified at this time. We will continue to monitor patient advancement through interdisciplinary progression rounds. If new patient transition needs arise, please place a TOC consult. ?  ?

## 2021-08-17 NOTE — Progress Notes (Signed)
*  PRELIMINARY RESULTS* ?Echocardiogram ?2D Echocardiogram has been performed. ? ?Alejandra Jimenez ?08/17/2021, 11:51 AM ?

## 2021-08-17 NOTE — Progress Notes (Signed)
Nsg Discharge Note ? ?Admit Date:  08/16/2021 ?Discharge date: 08/17/2021 ?  ?Darrick Grinder to be D/C'd Home per MD order.  AVS completed.  Copy for chart, and copy for patient signed, and dated. ?Patient/caregiver able to verbalize understanding. ? ?Discharge Medication: ?Allergies as of 08/17/2021   ? ?   Reactions  ? Tamiflu [oseltamivir] Nausea And Vomiting  ? ?  ? ?  ?Medication List  ?  ? ?TAKE these medications   ? ?levothyroxine 50 MCG tablet ?Commonly known as: SYNTHROID ?Take 1 tablet (50 mcg total) by mouth daily before breakfast. ?What changed: when to take this ?  ?lisinopril 5 MG tablet ?Commonly known as: ZESTRIL ?Take 1 tablet (5 mg total) by mouth daily. ?  ? ?  ? ? ?Discharge Assessment: ?Vitals:  ? 08/17/21 0630 08/17/21 1500  ?BP: (!) 123/54 (!) 125/53  ?Pulse: 81 90  ?Resp: 18 19  ?Temp: 98 ?F (36.7 ?C) 98.4 ?F (36.9 ?C)  ?SpO2: 97% 96%  ? Skin clean, dry and intact without evidence of skin break down, no evidence of skin tears noted. ?IV catheter discontinued intact. Site without signs and symptoms of complications - no redness or edema noted at insertion site, patient denies c/o pain - only slight tenderness at site.  Dressing with slight pressure applied. ? ?D/c Instructions-Education: ?Discharge instructions given to patient/family with verbalized understanding. ?D/c education completed with patient/family including follow up instructions, medication list, d/c activities limitations if indicated, with other d/c instructions as indicated by MD - patient able to verbalize understanding, all questions fully answered. ?Patient instructed to return to ED, call 911, or call MD for any changes in condition.  ?Patient escorted via [place], and D/C home via private auto. ? ?Dorcas Mcmurray, LPN ?07/15/9922 2:68 PM  ?

## 2021-08-22 DIAGNOSIS — R55 Syncope and collapse: Secondary | ICD-10-CM | POA: Diagnosis not present

## 2021-08-22 DIAGNOSIS — I10 Essential (primary) hypertension: Secondary | ICD-10-CM | POA: Diagnosis not present

## 2021-08-22 DIAGNOSIS — I700 Atherosclerosis of aorta: Secondary | ICD-10-CM | POA: Diagnosis not present

## 2021-08-22 DIAGNOSIS — Z299 Encounter for prophylactic measures, unspecified: Secondary | ICD-10-CM | POA: Diagnosis not present

## 2021-08-22 DIAGNOSIS — Z09 Encounter for follow-up examination after completed treatment for conditions other than malignant neoplasm: Secondary | ICD-10-CM | POA: Diagnosis not present

## 2021-08-31 ENCOUNTER — Other Ambulatory Visit: Payer: Self-pay | Admitting: Family Medicine

## 2021-08-31 ENCOUNTER — Ambulatory Visit: Payer: Medicare Other

## 2021-08-31 DIAGNOSIS — Z789 Other specified health status: Secondary | ICD-10-CM | POA: Diagnosis not present

## 2021-08-31 DIAGNOSIS — R55 Syncope and collapse: Secondary | ICD-10-CM

## 2021-08-31 DIAGNOSIS — R5383 Other fatigue: Secondary | ICD-10-CM | POA: Diagnosis not present

## 2021-08-31 DIAGNOSIS — E7800 Pure hypercholesterolemia, unspecified: Secondary | ICD-10-CM | POA: Diagnosis not present

## 2021-08-31 DIAGNOSIS — Z79899 Other long term (current) drug therapy: Secondary | ICD-10-CM | POA: Diagnosis not present

## 2021-08-31 DIAGNOSIS — Z0000 Encounter for general adult medical examination without abnormal findings: Secondary | ICD-10-CM | POA: Diagnosis not present

## 2021-08-31 DIAGNOSIS — E559 Vitamin D deficiency, unspecified: Secondary | ICD-10-CM | POA: Diagnosis not present

## 2021-08-31 DIAGNOSIS — E039 Hypothyroidism, unspecified: Secondary | ICD-10-CM | POA: Diagnosis not present

## 2021-08-31 DIAGNOSIS — Z6824 Body mass index (BMI) 24.0-24.9, adult: Secondary | ICD-10-CM | POA: Diagnosis not present

## 2021-08-31 DIAGNOSIS — Z1339 Encounter for screening examination for other mental health and behavioral disorders: Secondary | ICD-10-CM | POA: Diagnosis not present

## 2021-08-31 DIAGNOSIS — Z7189 Other specified counseling: Secondary | ICD-10-CM | POA: Diagnosis not present

## 2021-08-31 DIAGNOSIS — I10 Essential (primary) hypertension: Secondary | ICD-10-CM | POA: Diagnosis not present

## 2021-08-31 DIAGNOSIS — Z1331 Encounter for screening for depression: Secondary | ICD-10-CM | POA: Diagnosis not present

## 2021-08-31 DIAGNOSIS — Z299 Encounter for prophylactic measures, unspecified: Secondary | ICD-10-CM | POA: Diagnosis not present

## 2021-09-27 ENCOUNTER — Encounter: Payer: Self-pay | Admitting: Cardiology

## 2021-09-27 NOTE — Progress Notes (Signed)
? ? ?Cardiology Office Note ? ?Date: 09/28/2021  ? ?ID: Darrick Grinder, DOB April 27, 1935, MRN [id number] ? ?PCP:  Nicanor Bake C  ?Cardiologist:  Rozann Lesches, MD ?Electrophysiologist:  None  ? ?Chief Complaint  ?Patient presents with  ? History of syncope  ? ? ?History of Present Illness: ?Alejandra Jimenez is an 87 y.o. female referred for cardiology consultation by Dr. Woody Seller for evaluation of syncope.  I reviewed the available records.  She was hospitalized at [facility] in early April after an episode of apparent syncope.  Head CT reported no acute findings, she did not have any obvious arrhythmias on cardiac monitoring, high-sensitivity troponin I levels were normal.  Echocardiogram showed normal LVEF without significant valvular abnormalities.  She was found to be orthostatic and given IV fluids.  Outpatient cardiac monitor was recommended by hospitalist team. ? ?We discussed the event today.  She tells me that she was playing cards with some friends, stood up from the table to go answer the phone and when she got back to the table felt unusual, a ringing in her ears and somewhat uneasy.  She told her friends that she was going to go to her bedroom, stood up and walked into her bedroom, sat in a chair, and apparently had a syncopal event and fell to the floor.  This was not witnessed but her friends heard a thump and went to check on her.  EMS was summoned. ? ?She tells me that she has never had an event like this in the past.  Does not feel any sense of palpitations, functional with all ADLs at home.  No dizziness when she stands up.  She does not recall any illnesses around that time, no unusual fluid losses, no fevers or chills.  She is not on a diuretic. ? ?She has been on lisinopril for treatment of hypertension, checks blood pressure regularly at home. ? ?Past Medical History:  ?Diagnosis Date  ? Essential hypertension   ? Mixed hyperlipidemia   ? Pancreatic cyst   ? Skin cancer   ? URI (upper respiratory  infection)   ? ? ?Past Surgical History:  ?Procedure Laterality Date  ? CHOLECYSTECTOMY    ? SHOULDER SURGERY Left   ? ? ?Current Outpatient Medications  ?Medication Sig Dispense Refill  ? levothyroxine (SYNTHROID) 50 MCG tablet Take 1 tablet (50 mcg total) by mouth daily before breakfast. 90 tablet 3  ? lisinopril (ZESTRIL) 10 MG tablet Take 10 mg by mouth daily.    ? VITAMIN D PO Take 1 tablet by mouth daily.    ? ?No current facility-administered medications for this visit.  ? ?Allergies:  Tamiflu [oseltamivir]  ? ?Social History: The patient  reports that she has never smoked. She has never used smokeless tobacco. She reports that she does not drink alcohol and does not use drugs.  ? ?Family History: The patient's family history includes COPD in her father and mother.  ? ?ROS: No orthopnea or PND. ? ?Physical Exam: ?Supine blood pressure 159/77 with heart rate 77 ?Seated blood pressure 152/84 with heart rate 84 ?Standing blood pressure 136/83 with heart rate 104 (asymptomatic) ?VS:  SpO2 97% , BMI There is no height or weight on file to calculate BMI. ? ?Wt Readings from Last 3 Encounters:  ?08/16/21 151 lb (68.5 kg)  ?  ?General: Patient appears comfortable at rest. ?HEENT: Conjunctiva and lids normal, oropharynx clear. ?Neck: Supple, no elevated JVP or carotid bruits, no thyromegaly. ?Lungs: Clear to auscultation, nonlabored breathing  at rest. ?Cardiac: Regular rate and rhythm, no S3 or significant systolic murmur, no pericardial rub. ?Abdomen: Soft, nontender, bowel sounds present. ?Extremities: No pitting edema, distal pulses 2+. ?Skin: Warm and dry. ?Musculoskeletal: No kyphosis. ?Neuropsychiatric: Alert and oriented x3, affect grossly appropriate. ? ?ECG:  An ECG dated 08/16/2021 was personally reviewed today and demonstrated:  Sinus rhythm with nonspecific ST changes. ? ?Recent Labwork: ?08/16/2021: ALT 15; AST 23; Magnesium 1.9; TSH 0.861 ?08/17/2021: BUN 20; Creatinine, Ser 0.95; Hemoglobin 11.2; Platelets  162; Potassium 4.0; Sodium 136  ? ?Other Studies Reviewed Today: ? ?Head CT 08/16/2021: ?IMPRESSION: ?1. No acute intracranial findings. ?2. Mild generalized atrophy and white matter microvascular disease. ? ?Echocardiogram 08/17/2021: ? 1. Left ventricular ejection fraction, by estimation, is 65 to 70%. The  ?left ventricle has normal function. The left ventricle has no regional  ?wall motion abnormalities. Left ventricular diastolic parameters are  ?consistent with Grade I diastolic  ?dysfunction (impaired relaxation).  ? 2. Right ventricular systolic function is normal. The right ventricular  ?size is normal. There is moderately elevated pulmonary artery systolic  ?pressure.  ? 3. The mitral valve was not well visualized. No evidence of mitral valve  ?regurgitation. No evidence of mitral stenosis.  ? 4. The aortic valve is tricuspid. Aortic valve regurgitation is not  ?visualized. No aortic stenosis is present.  ? 5. The inferior vena cava is normal in size with greater than 50%  ?respiratory variability, suggesting right atrial pressure of 3 mmHg.  ? ?Assessment and Plan: ? ?1.  Episode of syncope as outlined above, possibly orthostatic at the time although trigger for this is not clear.  She may have some degree of autonomic dysfunction at baseline with mild orthostatic change today although she was asymptomatic and technically not hypotensive.  Cannot completely exclude a transient arrhythmia that was not caught when she was assessed in the hospital.  I talked with her again about considering a Zio patch for 7 days just to get a sense for heart rate variability, she is hesitant to start this now but will call us back.  Otherwise recommended that she track blood pressure at home, could consider cutting lisinopril back to 5 mg daily if her systolics are averaging in the 120s to 130s most of the time.  Also adequate hydration.  We will see her back down the road to discuss symptoms, sooner if there is a frank  recurrence. ? ?2.  Hypertension, currently on lisinopril 10 mg daily.  She will continue to track blood pressure at home. ? ?Medication Adjustments/Labs and Tests Ordered: ?Current medicines are reviewed at length with the patient today.  Concerns regarding medicines are outlined above.  ? ?Tests Ordered: ?No orders of the defined types were placed in this encounter. ? ? ?Medication Changes: ?No orders of the defined types were placed in this encounter. ? ? ?Disposition:  Follow up  6 months, sooner if needed. ? ?Signed, ?Satira Sark, MD, [facility] ?09/28/2021 9:46 AM    ?[facility] ?[place], [place], [place] [address] ?Phone: [phone]; Fax: [phone]  ?

## 2021-09-28 ENCOUNTER — Ambulatory Visit (INDEPENDENT_AMBULATORY_CARE_PROVIDER_SITE_OTHER): Payer: Medicare Other | Admitting: Cardiology

## 2021-09-28 ENCOUNTER — Ambulatory Visit: Payer: Medicare Other | Admitting: Cardiology

## 2021-09-28 ENCOUNTER — Encounter: Payer: Self-pay | Admitting: Cardiology

## 2021-09-28 DIAGNOSIS — R55 Syncope and collapse: Secondary | ICD-10-CM | POA: Diagnosis not present

## 2021-09-28 DIAGNOSIS — Z87898 Personal history of other specified conditions: Secondary | ICD-10-CM

## 2021-09-28 DIAGNOSIS — I10 Essential (primary) hypertension: Secondary | ICD-10-CM | POA: Diagnosis not present

## 2021-09-28 NOTE — Patient Instructions (Addendum)
Medication Instructions:  ?Your physician recommends that you continue on your current medications as directed. Please refer to the Current Medication list given to you today. ? ?Labwork: ?none ? ?Testing/Procedures: ?Call office if you decide to wear the ZIO XT monitor for 7 days to evaluate your syncope. [phone] ? ?Follow-Up: ?Your physician recommends that you schedule a follow-up appointment in: 6 months ? ?Any Other Special Instructions Will Be Listed Below (If Applicable). ? ?If you need a refill on your cardiac medications before your next appointment, please call your pharmacy. ?

## 2021-10-19 DIAGNOSIS — R059 Cough, unspecified: Secondary | ICD-10-CM | POA: Diagnosis not present

## 2021-10-19 DIAGNOSIS — Z789 Other specified health status: Secondary | ICD-10-CM | POA: Diagnosis not present

## 2021-10-19 DIAGNOSIS — Z299 Encounter for prophylactic measures, unspecified: Secondary | ICD-10-CM | POA: Diagnosis not present

## 2021-10-19 DIAGNOSIS — I10 Essential (primary) hypertension: Secondary | ICD-10-CM | POA: Diagnosis not present

## 2021-11-08 DIAGNOSIS — R059 Cough, unspecified: Secondary | ICD-10-CM | POA: Diagnosis not present

## 2021-11-08 DIAGNOSIS — I10 Essential (primary) hypertension: Secondary | ICD-10-CM | POA: Diagnosis not present

## 2021-11-08 DIAGNOSIS — Z789 Other specified health status: Secondary | ICD-10-CM | POA: Diagnosis not present

## 2021-11-08 DIAGNOSIS — Z299 Encounter for prophylactic measures, unspecified: Secondary | ICD-10-CM | POA: Diagnosis not present

## 2021-12-22 DIAGNOSIS — Z299 Encounter for prophylactic measures, unspecified: Secondary | ICD-10-CM | POA: Diagnosis not present

## 2021-12-22 DIAGNOSIS — Z789 Other specified health status: Secondary | ICD-10-CM | POA: Diagnosis not present

## 2021-12-22 DIAGNOSIS — I700 Atherosclerosis of aorta: Secondary | ICD-10-CM | POA: Diagnosis not present

## 2021-12-22 DIAGNOSIS — G319 Degenerative disease of nervous system, unspecified: Secondary | ICD-10-CM | POA: Diagnosis not present

## 2021-12-22 DIAGNOSIS — I10 Essential (primary) hypertension: Secondary | ICD-10-CM | POA: Diagnosis not present

## 2022-02-17 DIAGNOSIS — Z23 Encounter for immunization: Secondary | ICD-10-CM | POA: Diagnosis not present

## 2022-03-07 DIAGNOSIS — Z23 Encounter for immunization: Secondary | ICD-10-CM | POA: Diagnosis not present

## 2022-03-20 DIAGNOSIS — L309 Dermatitis, unspecified: Secondary | ICD-10-CM | POA: Diagnosis not present

## 2022-03-20 DIAGNOSIS — Z299 Encounter for prophylactic measures, unspecified: Secondary | ICD-10-CM | POA: Diagnosis not present

## 2022-03-20 DIAGNOSIS — I10 Essential (primary) hypertension: Secondary | ICD-10-CM | POA: Diagnosis not present

## 2022-03-30 DIAGNOSIS — I10 Essential (primary) hypertension: Secondary | ICD-10-CM | POA: Diagnosis not present

## 2022-03-30 DIAGNOSIS — E039 Hypothyroidism, unspecified: Secondary | ICD-10-CM | POA: Diagnosis not present

## 2022-03-30 DIAGNOSIS — Z299 Encounter for prophylactic measures, unspecified: Secondary | ICD-10-CM | POA: Diagnosis not present

## 2022-03-30 DIAGNOSIS — Z789 Other specified health status: Secondary | ICD-10-CM | POA: Diagnosis not present

## 2022-04-10 ENCOUNTER — Ambulatory Visit: Payer: Medicare Other | Admitting: Cardiology

## 2022-07-04 DIAGNOSIS — G319 Degenerative disease of nervous system, unspecified: Secondary | ICD-10-CM | POA: Diagnosis not present

## 2022-07-04 DIAGNOSIS — Z299 Encounter for prophylactic measures, unspecified: Secondary | ICD-10-CM | POA: Diagnosis not present

## 2022-07-04 DIAGNOSIS — I10 Essential (primary) hypertension: Secondary | ICD-10-CM | POA: Diagnosis not present

## 2022-07-04 DIAGNOSIS — J309 Allergic rhinitis, unspecified: Secondary | ICD-10-CM | POA: Diagnosis not present

## 2022-07-04 DIAGNOSIS — I700 Atherosclerosis of aorta: Secondary | ICD-10-CM | POA: Diagnosis not present

## 2022-07-06 DIAGNOSIS — J329 Chronic sinusitis, unspecified: Secondary | ICD-10-CM | POA: Diagnosis not present

## 2022-07-06 DIAGNOSIS — Z299 Encounter for prophylactic measures, unspecified: Secondary | ICD-10-CM | POA: Diagnosis not present

## 2022-07-06 DIAGNOSIS — R0981 Nasal congestion: Secondary | ICD-10-CM | POA: Diagnosis not present

## 2022-08-12 IMAGING — DX DG CHEST 2V
2 series · 2 of 2 positions shown · non-contrast
Comparison: July 18, 2017

CLINICAL DATA: Syncope

EXAM:
CHEST - 2 VIEW

[chest pa]
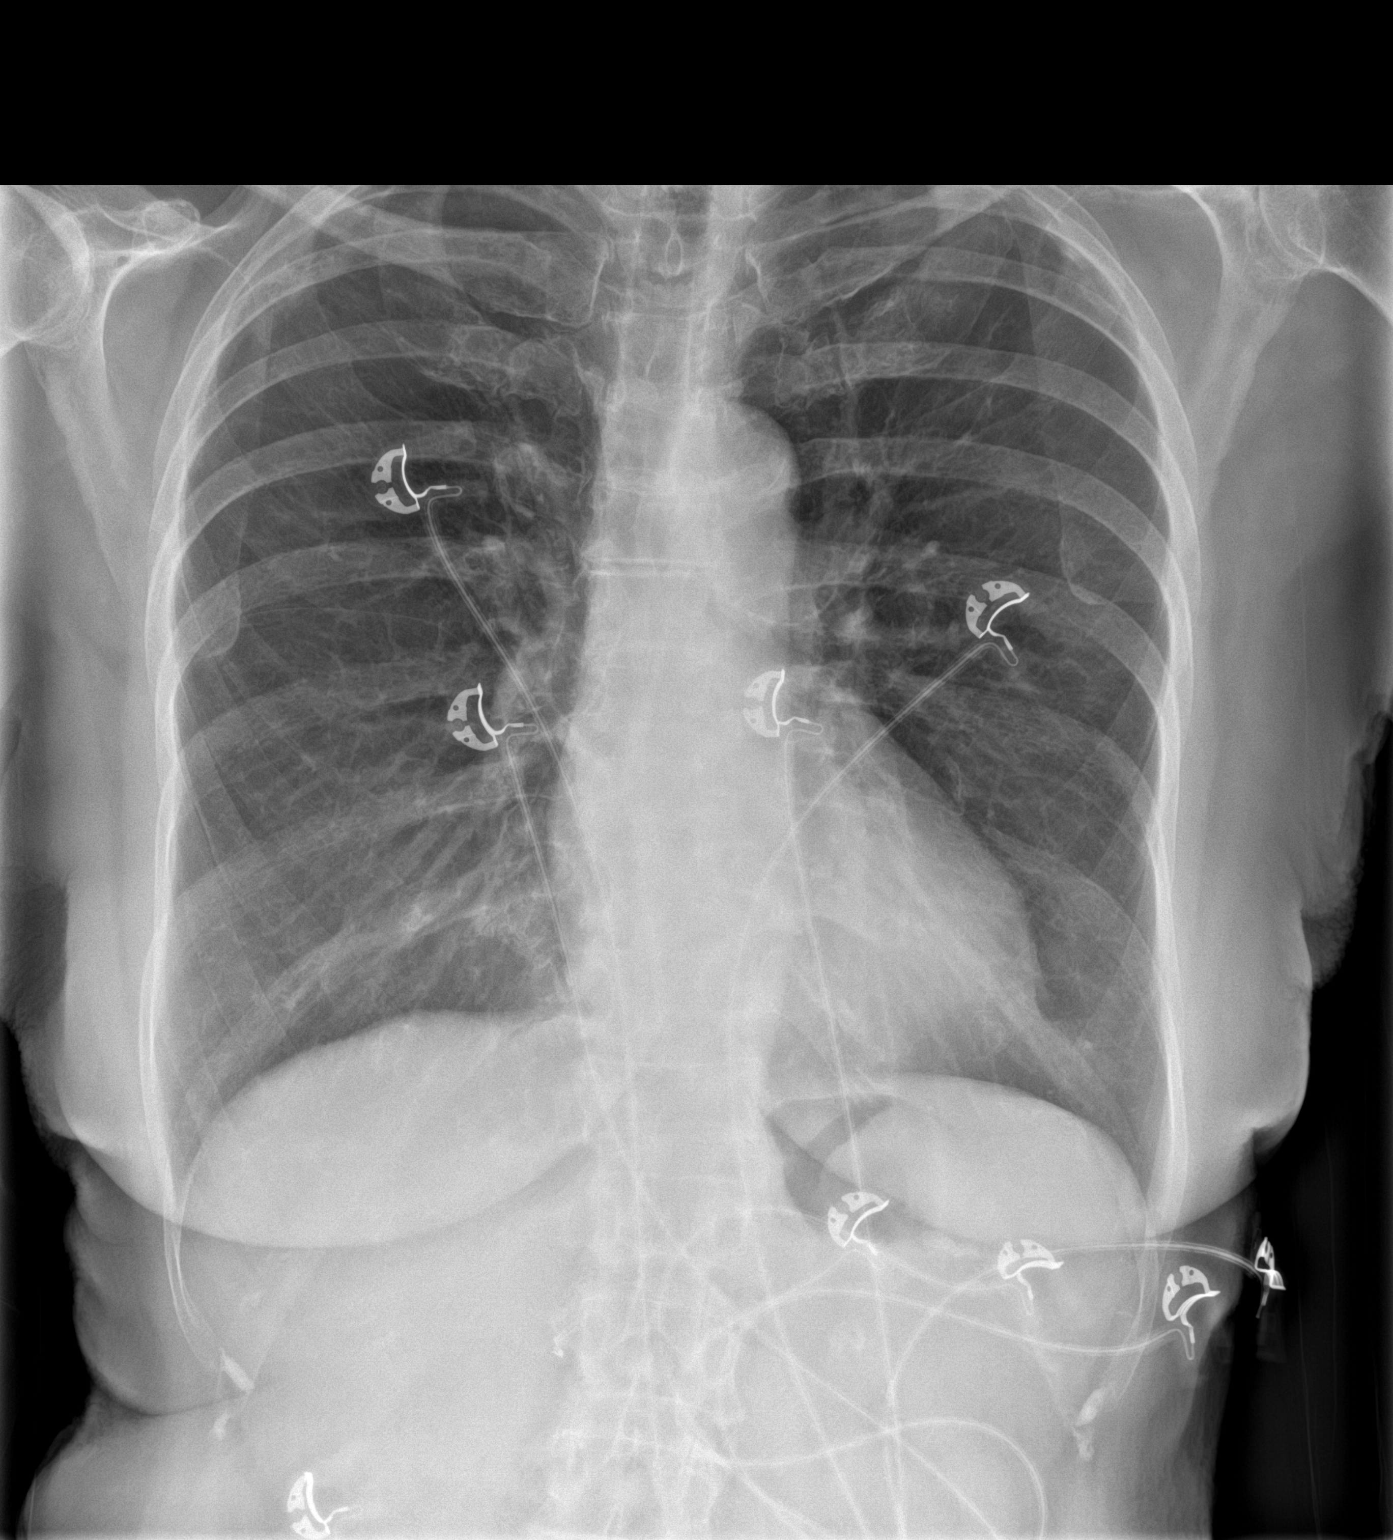

[chest lat]
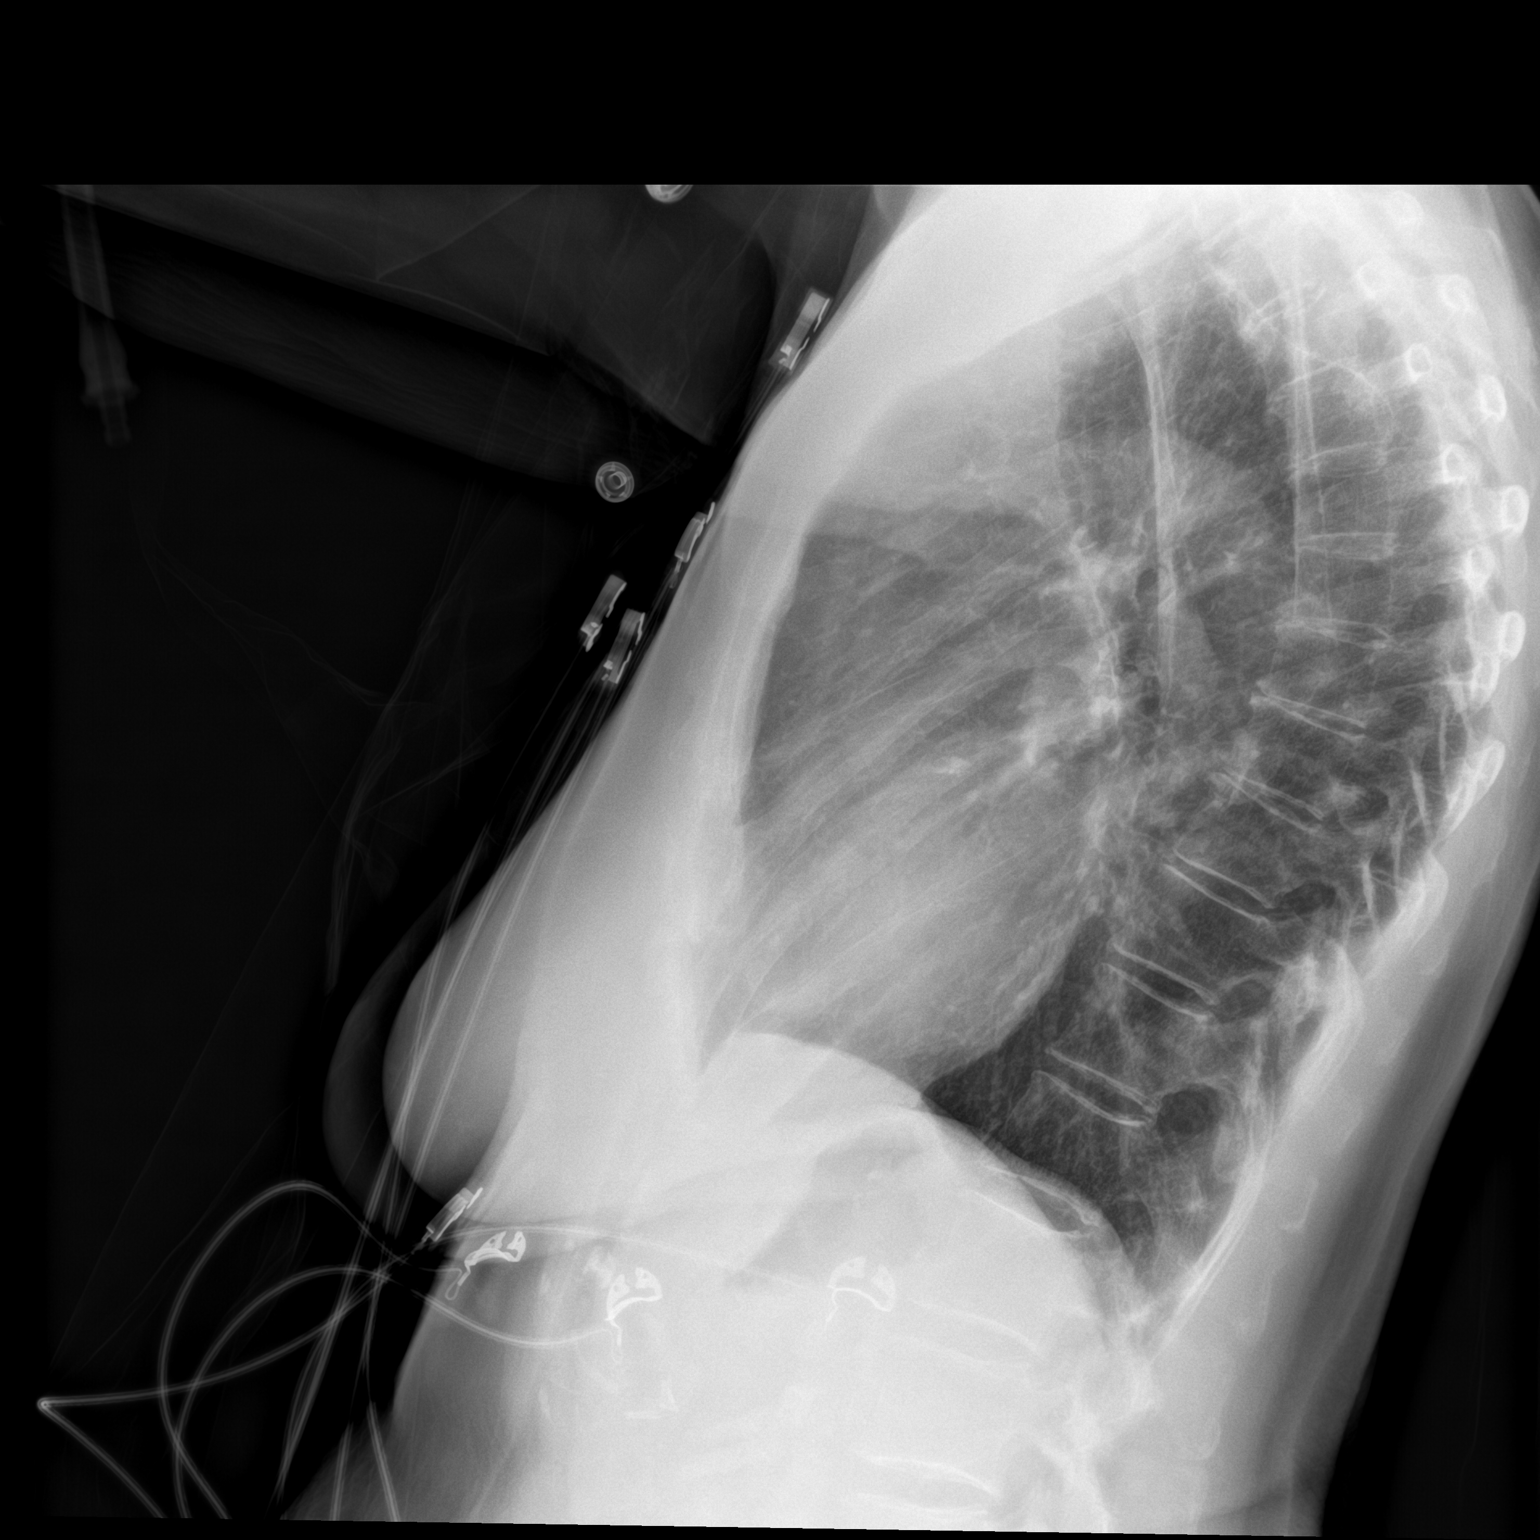

[2 of 2 positions shown; findings below may reference images not displayed]

FINDINGS: The heart size and mediastinal contours are within normal limits.
Aortic atherosclerosis. Biapical pleural thickening similar prior.
Chronic senescent lung changes. No focal airspace consolidation or
overt pulmonary edema. No pleural effusion. No pneumothorax. No
acute osseous abnormality.
IMPRESSION: 1. No acute cardiopulmonary findings.
2.  Aortic Atherosclerosis ([id number]).

## 2022-08-12 IMAGING — CT CT HEAD W/O CM
3 of 4 series · 15 of 47 positions shown, 18 images · non-contrast
Comparison: None.

CLINICAL DATA: Mental status change.



[Series 2: head w o · axial · 0.41mm/px · z∈[+41,+161]mm · 9 of 30 slices shown, 12 images]
[im 3/30  brain]
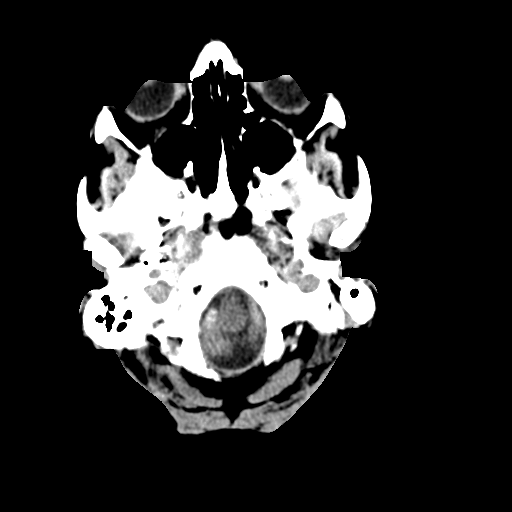
[im 3/30  bone]
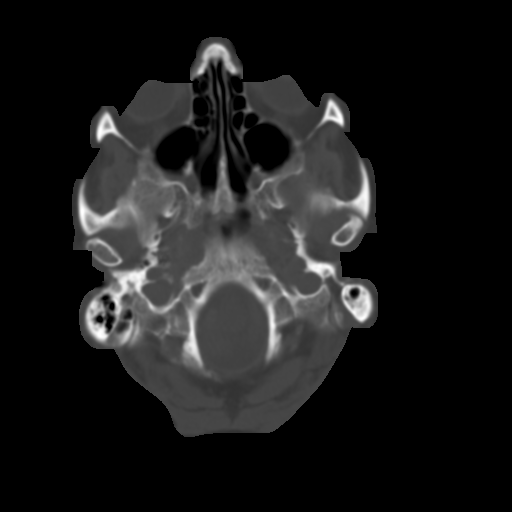
[im 7/30  brain]
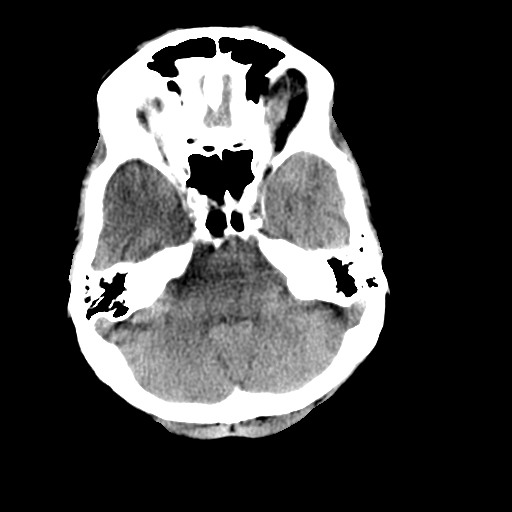
[im 9/30  brain]
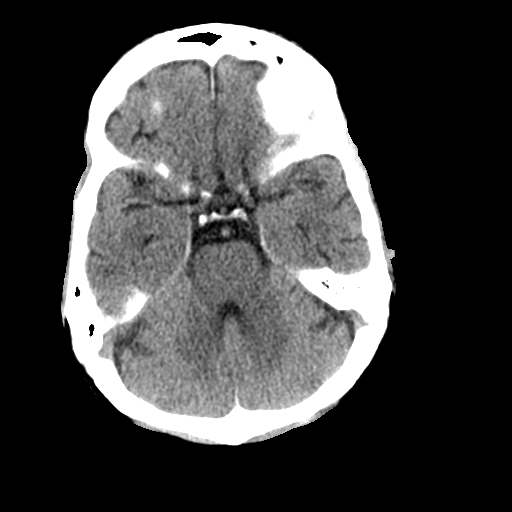
[im 13/30  brain]
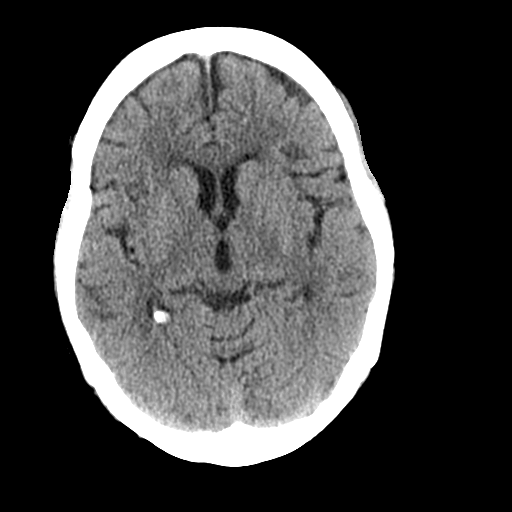
[im 15/30  brain]
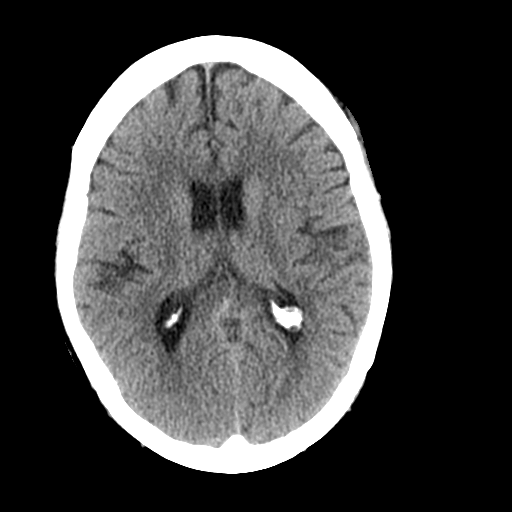
[im 15/30  bone]
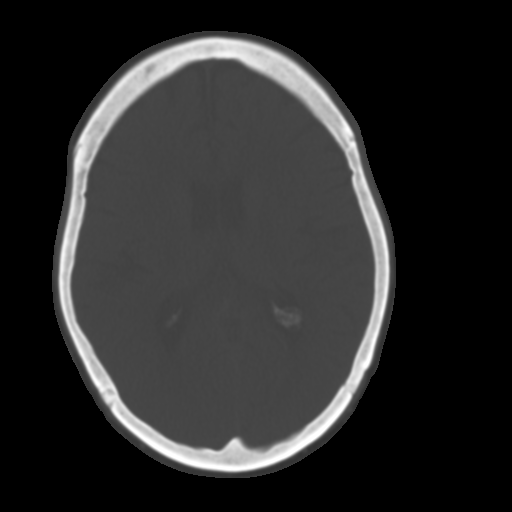
[im 17/30  brain]
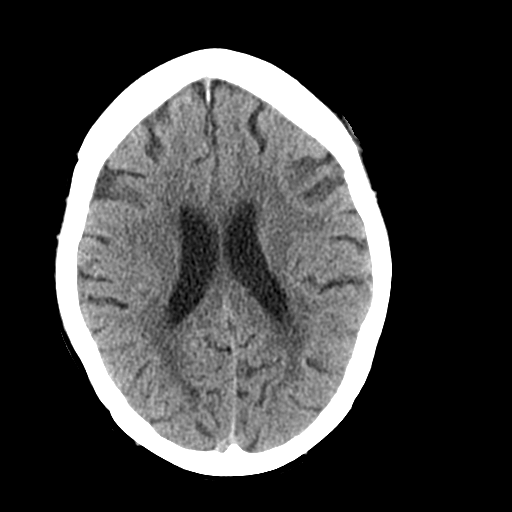
[im 21/30  brain]
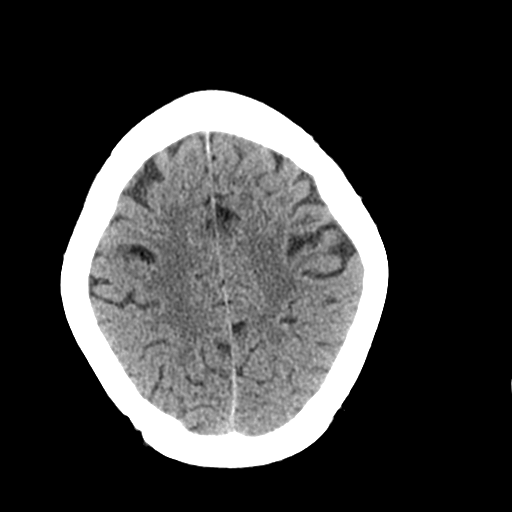
[im 23/30  brain]
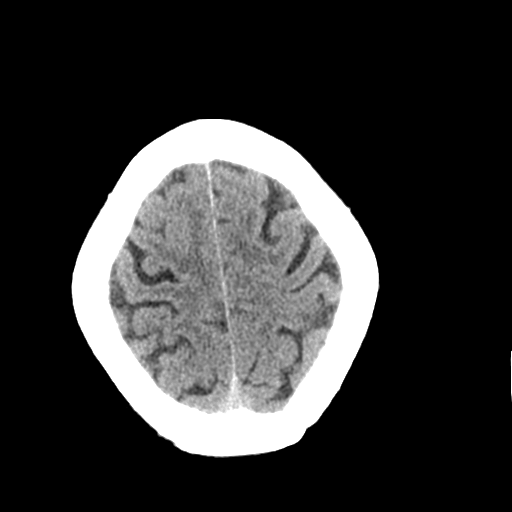
[im 27/30  brain]
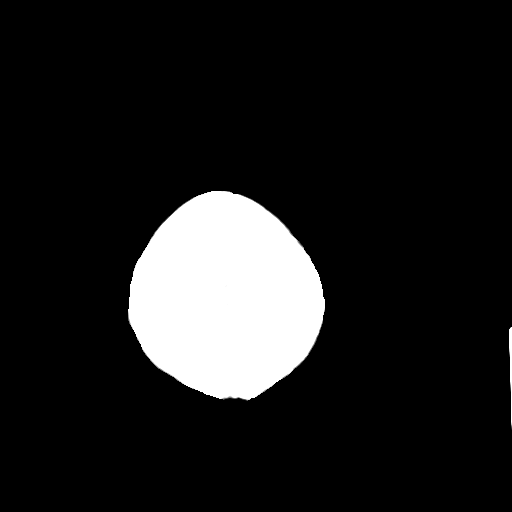
[im 27/30  bone]
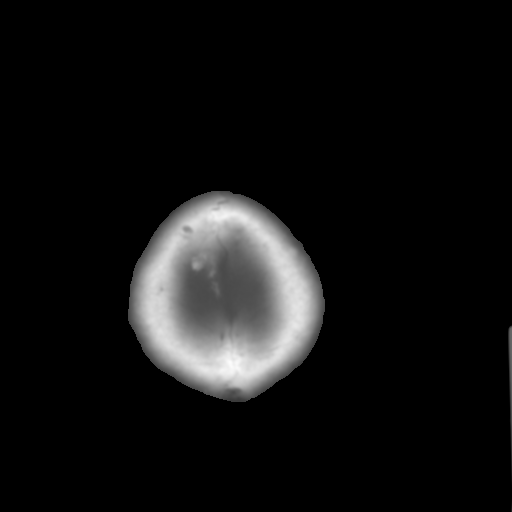

[Series 4: coronal soft · coronal · 0.31mm/px · 3 of 70 slices shown]
[im 24/70  brain]
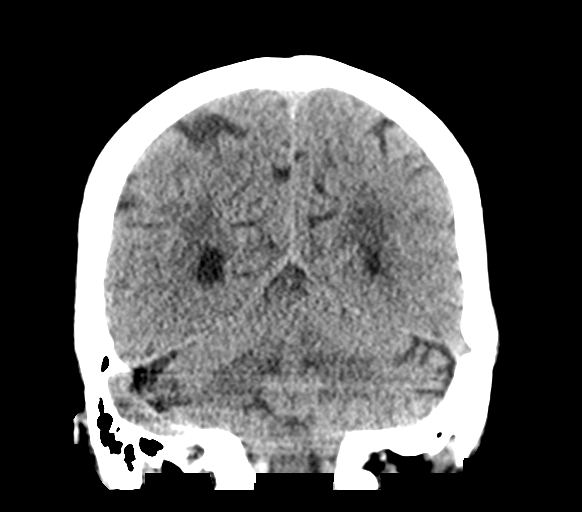
[im 31/70  brain]
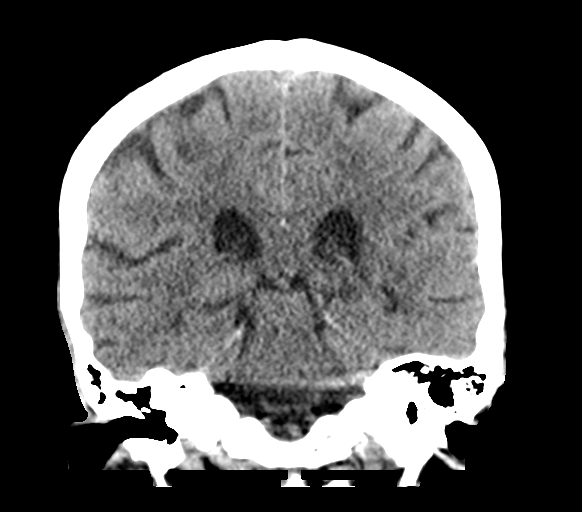
[im 39/70  brain]
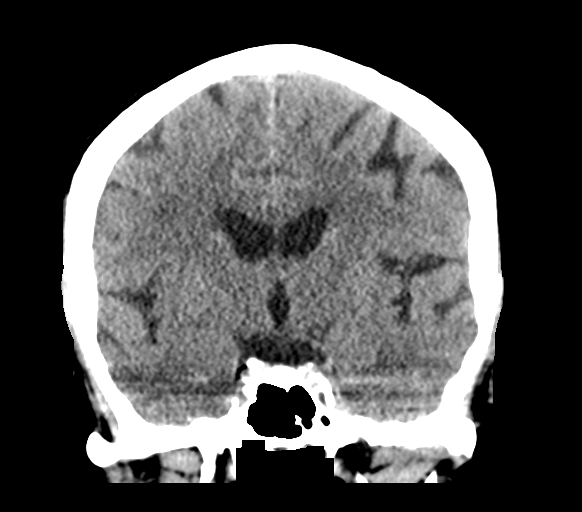

[Series 5: sagittal soft · sagittal · 0.33mm/px · 3 of 56 slices shown]
[im 19/56  brain]
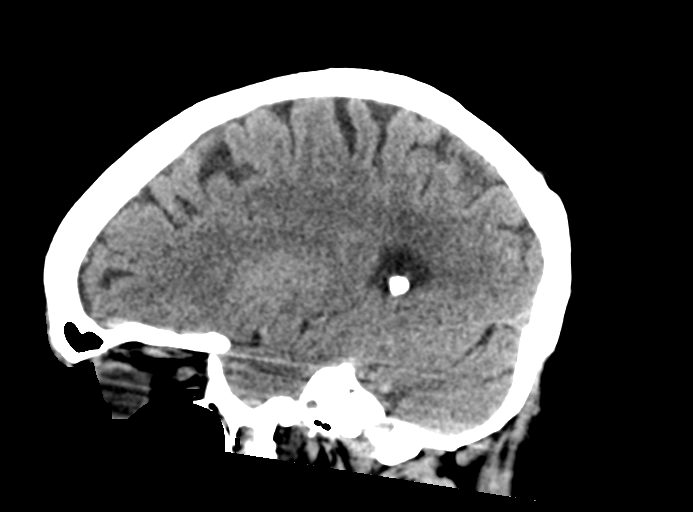
[im 28/56  brain]
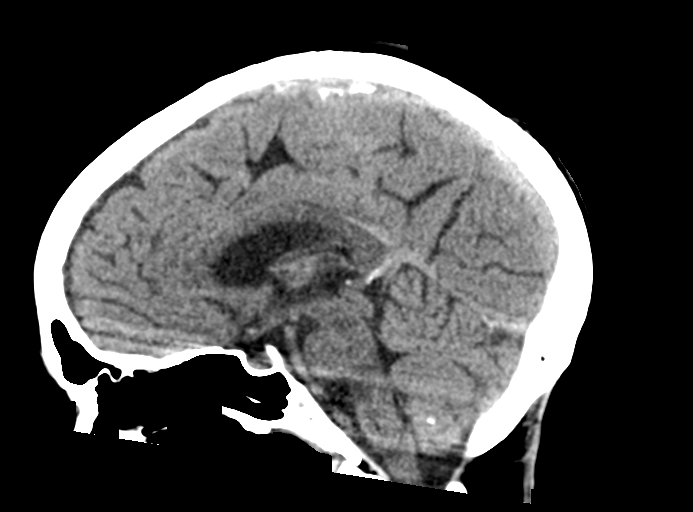
[im 37/56  brain]
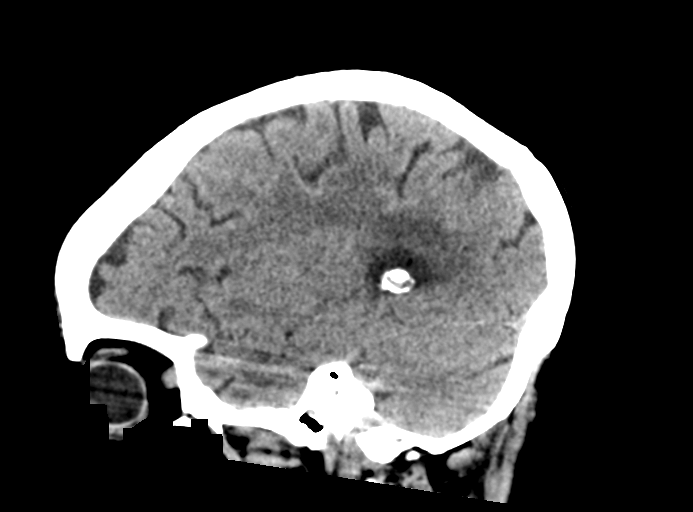

[15 of 47 positions shown; findings below may reference images not displayed]

FINDINGS: Brain: No acute intracranial hemorrhage. No focal mass lesion. No CT
evidence of acute infarction. No midline shift or mass effect. No
hydrocephalus. Basilar cisterns are patent.

There are mild periventricular and subcortical white matter
hypodensities. Generalized cortical atrophy.

Vascular: No hyperdense vessel or unexpected calcification.

Skull: Normal. Negative for fracture or focal lesion.

Sinuses/Orbits: Paranasal sinuses and mastoid air cells are clear.
Orbits are clear.

Other: None.
IMPRESSION: 1. No acute intracranial findings.
2. Mild generalized atrophy and white matter microvascular disease.

## 2022-08-22 DIAGNOSIS — D3131 Benign neoplasm of right choroid: Secondary | ICD-10-CM | POA: Diagnosis not present

## 2022-08-22 DIAGNOSIS — H25813 Combined forms of age-related cataract, bilateral: Secondary | ICD-10-CM | POA: Diagnosis not present

## 2022-09-07 DIAGNOSIS — Z0000 Encounter for general adult medical examination without abnormal findings: Secondary | ICD-10-CM | POA: Diagnosis not present

## 2022-09-07 DIAGNOSIS — Z299 Encounter for prophylactic measures, unspecified: Secondary | ICD-10-CM | POA: Diagnosis not present

## 2022-09-07 DIAGNOSIS — Z1339 Encounter for screening examination for other mental health and behavioral disorders: Secondary | ICD-10-CM | POA: Diagnosis not present

## 2022-09-07 DIAGNOSIS — Z7189 Other specified counseling: Secondary | ICD-10-CM | POA: Diagnosis not present

## 2022-09-07 DIAGNOSIS — E7800 Pure hypercholesterolemia, unspecified: Secondary | ICD-10-CM | POA: Diagnosis not present

## 2022-09-07 DIAGNOSIS — I10 Essential (primary) hypertension: Secondary | ICD-10-CM | POA: Diagnosis not present

## 2022-09-07 DIAGNOSIS — Z1331 Encounter for screening for depression: Secondary | ICD-10-CM | POA: Diagnosis not present

## 2022-09-07 DIAGNOSIS — Z79899 Other long term (current) drug therapy: Secondary | ICD-10-CM | POA: Diagnosis not present

## 2022-09-07 DIAGNOSIS — E559 Vitamin D deficiency, unspecified: Secondary | ICD-10-CM | POA: Diagnosis not present

## 2022-09-07 DIAGNOSIS — E039 Hypothyroidism, unspecified: Secondary | ICD-10-CM | POA: Diagnosis not present

## 2022-09-07 DIAGNOSIS — R5383 Other fatigue: Secondary | ICD-10-CM | POA: Diagnosis not present

## 2022-12-05 DIAGNOSIS — G319 Degenerative disease of nervous system, unspecified: Secondary | ICD-10-CM | POA: Diagnosis not present

## 2022-12-05 DIAGNOSIS — I10 Essential (primary) hypertension: Secondary | ICD-10-CM | POA: Diagnosis not present

## 2022-12-05 DIAGNOSIS — Z299 Encounter for prophylactic measures, unspecified: Secondary | ICD-10-CM | POA: Diagnosis not present

## 2022-12-05 DIAGNOSIS — I700 Atherosclerosis of aorta: Secondary | ICD-10-CM | POA: Diagnosis not present

## 2023-03-07 DIAGNOSIS — Z299 Encounter for prophylactic measures, unspecified: Secondary | ICD-10-CM | POA: Diagnosis not present

## 2023-03-07 DIAGNOSIS — Z23 Encounter for immunization: Secondary | ICD-10-CM | POA: Diagnosis not present

## 2023-03-07 DIAGNOSIS — E039 Hypothyroidism, unspecified: Secondary | ICD-10-CM | POA: Diagnosis not present

## 2023-03-07 DIAGNOSIS — I10 Essential (primary) hypertension: Secondary | ICD-10-CM | POA: Diagnosis not present

## 2023-04-02 DIAGNOSIS — Z299 Encounter for prophylactic measures, unspecified: Secondary | ICD-10-CM | POA: Diagnosis not present

## 2023-04-02 DIAGNOSIS — I10 Essential (primary) hypertension: Secondary | ICD-10-CM | POA: Diagnosis not present

## 2023-04-02 DIAGNOSIS — J309 Allergic rhinitis, unspecified: Secondary | ICD-10-CM | POA: Diagnosis not present

## 2023-04-05 DIAGNOSIS — I700 Atherosclerosis of aorta: Secondary | ICD-10-CM | POA: Diagnosis not present

## 2023-04-05 DIAGNOSIS — R059 Cough, unspecified: Secondary | ICD-10-CM | POA: Diagnosis not present

## 2023-04-05 DIAGNOSIS — Z299 Encounter for prophylactic measures, unspecified: Secondary | ICD-10-CM | POA: Diagnosis not present

## 2023-04-05 DIAGNOSIS — J069 Acute upper respiratory infection, unspecified: Secondary | ICD-10-CM | POA: Diagnosis not present

## 2023-04-05 DIAGNOSIS — I739 Peripheral vascular disease, unspecified: Secondary | ICD-10-CM | POA: Diagnosis not present

## 2023-06-12 DIAGNOSIS — Z299 Encounter for prophylactic measures, unspecified: Secondary | ICD-10-CM | POA: Diagnosis not present

## 2023-06-12 DIAGNOSIS — I739 Peripheral vascular disease, unspecified: Secondary | ICD-10-CM | POA: Diagnosis not present

## 2023-06-12 DIAGNOSIS — I700 Atherosclerosis of aorta: Secondary | ICD-10-CM | POA: Diagnosis not present

## 2023-06-12 DIAGNOSIS — G319 Degenerative disease of nervous system, unspecified: Secondary | ICD-10-CM | POA: Diagnosis not present

## 2023-06-12 DIAGNOSIS — D692 Other nonthrombocytopenic purpura: Secondary | ICD-10-CM | POA: Diagnosis not present

## 2023-06-12 DIAGNOSIS — I10 Essential (primary) hypertension: Secondary | ICD-10-CM | POA: Diagnosis not present

## 2023-07-31 DIAGNOSIS — Z299 Encounter for prophylactic measures, unspecified: Secondary | ICD-10-CM | POA: Diagnosis not present

## 2023-07-31 DIAGNOSIS — I10 Essential (primary) hypertension: Secondary | ICD-10-CM | POA: Diagnosis not present

## 2023-07-31 DIAGNOSIS — N390 Urinary tract infection, site not specified: Secondary | ICD-10-CM | POA: Diagnosis not present

## 2023-07-31 DIAGNOSIS — D692 Other nonthrombocytopenic purpura: Secondary | ICD-10-CM | POA: Diagnosis not present

## 2023-07-31 DIAGNOSIS — I700 Atherosclerosis of aorta: Secondary | ICD-10-CM | POA: Diagnosis not present

## 2023-07-31 DIAGNOSIS — M549 Dorsalgia, unspecified: Secondary | ICD-10-CM | POA: Diagnosis not present

## 2023-08-30 DIAGNOSIS — Z7189 Other specified counseling: Secondary | ICD-10-CM | POA: Diagnosis not present

## 2023-08-30 DIAGNOSIS — E7800 Pure hypercholesterolemia, unspecified: Secondary | ICD-10-CM | POA: Diagnosis not present

## 2023-08-30 DIAGNOSIS — Z0000 Encounter for general adult medical examination without abnormal findings: Secondary | ICD-10-CM | POA: Diagnosis not present

## 2023-08-30 DIAGNOSIS — Z299 Encounter for prophylactic measures, unspecified: Secondary | ICD-10-CM | POA: Diagnosis not present

## 2023-08-30 DIAGNOSIS — Z1339 Encounter for screening examination for other mental health and behavioral disorders: Secondary | ICD-10-CM | POA: Diagnosis not present

## 2023-08-30 DIAGNOSIS — Z1331 Encounter for screening for depression: Secondary | ICD-10-CM | POA: Diagnosis not present

## 2023-08-30 DIAGNOSIS — E039 Hypothyroidism, unspecified: Secondary | ICD-10-CM | POA: Diagnosis not present

## 2023-08-30 DIAGNOSIS — E559 Vitamin D deficiency, unspecified: Secondary | ICD-10-CM | POA: Diagnosis not present

## 2023-08-30 DIAGNOSIS — R5383 Other fatigue: Secondary | ICD-10-CM | POA: Diagnosis not present

## 2023-08-30 DIAGNOSIS — I10 Essential (primary) hypertension: Secondary | ICD-10-CM | POA: Diagnosis not present

## 2023-08-30 DIAGNOSIS — Z79899 Other long term (current) drug therapy: Secondary | ICD-10-CM | POA: Diagnosis not present

## 2023-09-13 DIAGNOSIS — M25562 Pain in left knee: Secondary | ICD-10-CM | POA: Diagnosis not present

## 2023-09-13 DIAGNOSIS — I10 Essential (primary) hypertension: Secondary | ICD-10-CM | POA: Diagnosis not present

## 2023-09-13 DIAGNOSIS — M25561 Pain in right knee: Secondary | ICD-10-CM | POA: Diagnosis not present

## 2023-09-13 DIAGNOSIS — M1990 Unspecified osteoarthritis, unspecified site: Secondary | ICD-10-CM | POA: Diagnosis not present

## 2023-09-13 DIAGNOSIS — Z299 Encounter for prophylactic measures, unspecified: Secondary | ICD-10-CM | POA: Diagnosis not present

## 2023-09-13 DIAGNOSIS — I700 Atherosclerosis of aorta: Secondary | ICD-10-CM | POA: Diagnosis not present

## 2023-09-22 DIAGNOSIS — R079 Chest pain, unspecified: Secondary | ICD-10-CM | POA: Diagnosis not present

## 2023-09-22 DIAGNOSIS — R519 Headache, unspecified: Secondary | ICD-10-CM | POA: Diagnosis not present

## 2023-09-22 DIAGNOSIS — R918 Other nonspecific abnormal finding of lung field: Secondary | ICD-10-CM | POA: Diagnosis not present

## 2023-09-22 DIAGNOSIS — I728 Aneurysm of other specified arteries: Secondary | ICD-10-CM | POA: Diagnosis not present

## 2023-09-22 DIAGNOSIS — N281 Cyst of kidney, acquired: Secondary | ICD-10-CM | POA: Diagnosis not present

## 2023-09-22 DIAGNOSIS — J929 Pleural plaque without asbestos: Secondary | ICD-10-CM | POA: Diagnosis not present

## 2023-09-22 DIAGNOSIS — R911 Solitary pulmonary nodule: Secondary | ICD-10-CM | POA: Diagnosis not present

## 2023-09-22 DIAGNOSIS — I4891 Unspecified atrial fibrillation: Secondary | ICD-10-CM | POA: Diagnosis not present

## 2023-09-22 DIAGNOSIS — R55 Syncope and collapse: Secondary | ICD-10-CM | POA: Diagnosis not present

## 2023-09-24 DIAGNOSIS — I480 Paroxysmal atrial fibrillation: Secondary | ICD-10-CM | POA: Diagnosis not present

## 2023-09-24 DIAGNOSIS — Z299 Encounter for prophylactic measures, unspecified: Secondary | ICD-10-CM | POA: Diagnosis not present

## 2023-09-24 DIAGNOSIS — I10 Essential (primary) hypertension: Secondary | ICD-10-CM | POA: Diagnosis not present

## 2023-09-25 ENCOUNTER — Telehealth: Payer: Self-pay | Admitting: Cardiology

## 2023-09-25 NOTE — Telephone Encounter (Signed)
 Pt came into [place] office wanting to be seen next Tuesday with Dr.Mcdowell.He has not been seen since 2023 and We currently do not have any appt until July 19 in the [place] office. He did not want to go to [place]. He refused the 19th and asked for a different day and wanted a Tuesday appt. Our next avaliable Tuesday is September 9th. Pt was not happy with us  and left.

## 2023-10-02 DIAGNOSIS — R55 Syncope and collapse: Secondary | ICD-10-CM | POA: Diagnosis not present

## 2023-11-30 ENCOUNTER — Ambulatory Visit: Admitting: Cardiology

## 2023-12-05 DIAGNOSIS — E039 Hypothyroidism, unspecified: Secondary | ICD-10-CM | POA: Diagnosis not present

## 2023-12-05 DIAGNOSIS — M25472 Effusion, left ankle: Secondary | ICD-10-CM | POA: Diagnosis not present

## 2023-12-05 DIAGNOSIS — I493 Ventricular premature depolarization: Secondary | ICD-10-CM | POA: Diagnosis not present

## 2023-12-05 DIAGNOSIS — I10 Essential (primary) hypertension: Secondary | ICD-10-CM | POA: Diagnosis not present

## 2023-12-05 DIAGNOSIS — I272 Pulmonary hypertension, unspecified: Secondary | ICD-10-CM | POA: Diagnosis not present

## 2023-12-05 DIAGNOSIS — R5383 Other fatigue: Secondary | ICD-10-CM | POA: Diagnosis not present

## 2023-12-05 DIAGNOSIS — M25474 Effusion, right foot: Secondary | ICD-10-CM | POA: Diagnosis not present

## 2023-12-05 DIAGNOSIS — M25471 Effusion, right ankle: Secondary | ICD-10-CM | POA: Diagnosis not present

## 2023-12-05 DIAGNOSIS — R6889 Other general symptoms and signs: Secondary | ICD-10-CM | POA: Diagnosis not present

## 2023-12-05 DIAGNOSIS — M25475 Effusion, left foot: Secondary | ICD-10-CM | POA: Diagnosis not present

## 2023-12-14 DIAGNOSIS — I493 Ventricular premature depolarization: Secondary | ICD-10-CM | POA: Diagnosis not present

## 2023-12-14 DIAGNOSIS — I071 Rheumatic tricuspid insufficiency: Secondary | ICD-10-CM | POA: Diagnosis not present

## 2023-12-14 DIAGNOSIS — I10 Essential (primary) hypertension: Secondary | ICD-10-CM | POA: Diagnosis not present

## 2023-12-14 DIAGNOSIS — I3481 Nonrheumatic mitral (valve) annulus calcification: Secondary | ICD-10-CM | POA: Diagnosis not present

## 2023-12-14 DIAGNOSIS — I272 Pulmonary hypertension, unspecified: Secondary | ICD-10-CM | POA: Diagnosis not present

## 2023-12-14 DIAGNOSIS — I340 Nonrheumatic mitral (valve) insufficiency: Secondary | ICD-10-CM | POA: Diagnosis not present

## 2023-12-24 DIAGNOSIS — I480 Paroxysmal atrial fibrillation: Secondary | ICD-10-CM | POA: Diagnosis not present

## 2023-12-24 DIAGNOSIS — I493 Ventricular premature depolarization: Secondary | ICD-10-CM | POA: Diagnosis not present

## 2023-12-24 DIAGNOSIS — I491 Atrial premature depolarization: Secondary | ICD-10-CM | POA: Diagnosis not present

## 2024-01-01 DIAGNOSIS — R0600 Dyspnea, unspecified: Secondary | ICD-10-CM | POA: Diagnosis not present

## 2024-01-01 DIAGNOSIS — R0602 Shortness of breath: Secondary | ICD-10-CM | POA: Diagnosis not present

## 2024-01-01 DIAGNOSIS — R000 Tachycardia, unspecified: Secondary | ICD-10-CM | POA: Diagnosis not present

## 2024-01-01 DIAGNOSIS — R9439 Abnormal result of other cardiovascular function study: Secondary | ICD-10-CM | POA: Diagnosis not present

## 2024-01-01 DIAGNOSIS — R9430 Abnormal result of cardiovascular function study, unspecified: Secondary | ICD-10-CM | POA: Diagnosis not present

## 2024-01-24 DIAGNOSIS — R000 Tachycardia, unspecified: Secondary | ICD-10-CM | POA: Diagnosis not present

## 2024-01-24 DIAGNOSIS — R0602 Shortness of breath: Secondary | ICD-10-CM | POA: Diagnosis not present

## 2024-01-31 DIAGNOSIS — R008 Other abnormalities of heart beat: Secondary | ICD-10-CM | POA: Diagnosis not present

## 2024-01-31 DIAGNOSIS — R000 Tachycardia, unspecified: Secondary | ICD-10-CM | POA: Diagnosis not present

## 2024-01-31 DIAGNOSIS — I498 Other specified cardiac arrhythmias: Secondary | ICD-10-CM | POA: Diagnosis not present

## 2024-01-31 DIAGNOSIS — I4891 Unspecified atrial fibrillation: Secondary | ICD-10-CM | POA: Diagnosis not present

## 2024-01-31 DIAGNOSIS — I491 Atrial premature depolarization: Secondary | ICD-10-CM | POA: Diagnosis not present

## 2024-01-31 DIAGNOSIS — I480 Paroxysmal atrial fibrillation: Secondary | ICD-10-CM | POA: Diagnosis not present

## 2024-01-31 DIAGNOSIS — I493 Ventricular premature depolarization: Secondary | ICD-10-CM | POA: Diagnosis not present

## 2024-02-18 DIAGNOSIS — I2510 Atherosclerotic heart disease of native coronary artery without angina pectoris: Secondary | ICD-10-CM | POA: Diagnosis not present

## 2024-02-29 DIAGNOSIS — D0439 Carcinoma in situ of skin of other parts of face: Secondary | ICD-10-CM | POA: Diagnosis not present

## 2024-02-29 DIAGNOSIS — D485 Neoplasm of uncertain behavior of skin: Secondary | ICD-10-CM | POA: Diagnosis not present

## 2024-03-06 DIAGNOSIS — R52 Pain, unspecified: Secondary | ICD-10-CM | POA: Diagnosis not present

## 2024-03-06 DIAGNOSIS — Z299 Encounter for prophylactic measures, unspecified: Secondary | ICD-10-CM | POA: Diagnosis not present

## 2024-03-06 DIAGNOSIS — M25562 Pain in left knee: Secondary | ICD-10-CM | POA: Diagnosis not present

## 2024-03-06 DIAGNOSIS — M1990 Unspecified osteoarthritis, unspecified site: Secondary | ICD-10-CM | POA: Diagnosis not present

## 2024-03-06 DIAGNOSIS — M25561 Pain in right knee: Secondary | ICD-10-CM | POA: Diagnosis not present

## 2024-03-06 DIAGNOSIS — I10 Essential (primary) hypertension: Secondary | ICD-10-CM | POA: Diagnosis not present

## 2024-03-20 DIAGNOSIS — R6889 Other general symptoms and signs: Secondary | ICD-10-CM | POA: Diagnosis not present

## 2024-03-20 DIAGNOSIS — M25475 Effusion, left foot: Secondary | ICD-10-CM | POA: Diagnosis not present

## 2024-03-20 DIAGNOSIS — M25471 Effusion, right ankle: Secondary | ICD-10-CM | POA: Diagnosis not present

## 2024-03-20 DIAGNOSIS — R5383 Other fatigue: Secondary | ICD-10-CM | POA: Diagnosis not present

## 2024-03-20 DIAGNOSIS — R531 Weakness: Secondary | ICD-10-CM | POA: Diagnosis not present

## 2024-03-20 DIAGNOSIS — M25474 Effusion, right foot: Secondary | ICD-10-CM | POA: Diagnosis not present

## 2024-03-20 DIAGNOSIS — M25472 Effusion, left ankle: Secondary | ICD-10-CM | POA: Diagnosis not present

## 2024-03-26 DIAGNOSIS — R6889 Other general symptoms and signs: Secondary | ICD-10-CM | POA: Diagnosis not present

## 2024-03-26 DIAGNOSIS — M25474 Effusion, right foot: Secondary | ICD-10-CM | POA: Diagnosis not present

## 2024-03-26 DIAGNOSIS — M25472 Effusion, left ankle: Secondary | ICD-10-CM | POA: Diagnosis not present

## 2024-03-26 DIAGNOSIS — R531 Weakness: Secondary | ICD-10-CM | POA: Diagnosis not present

## 2024-03-26 DIAGNOSIS — M25471 Effusion, right ankle: Secondary | ICD-10-CM | POA: Diagnosis not present

## 2024-03-26 DIAGNOSIS — R5383 Other fatigue: Secondary | ICD-10-CM | POA: Diagnosis not present

## 2024-03-31 DIAGNOSIS — R531 Weakness: Secondary | ICD-10-CM | POA: Diagnosis not present

## 2024-03-31 DIAGNOSIS — R5383 Other fatigue: Secondary | ICD-10-CM | POA: Diagnosis not present

## 2024-03-31 DIAGNOSIS — M25474 Effusion, right foot: Secondary | ICD-10-CM | POA: Diagnosis not present

## 2024-03-31 DIAGNOSIS — R6889 Other general symptoms and signs: Secondary | ICD-10-CM | POA: Diagnosis not present

## 2024-03-31 DIAGNOSIS — M25472 Effusion, left ankle: Secondary | ICD-10-CM | POA: Diagnosis not present

## 2024-03-31 DIAGNOSIS — M25471 Effusion, right ankle: Secondary | ICD-10-CM | POA: Diagnosis not present

## 2024-04-02 DIAGNOSIS — M25471 Effusion, right ankle: Secondary | ICD-10-CM | POA: Diagnosis not present

## 2024-04-02 DIAGNOSIS — R5383 Other fatigue: Secondary | ICD-10-CM | POA: Diagnosis not present

## 2024-04-02 DIAGNOSIS — R531 Weakness: Secondary | ICD-10-CM | POA: Diagnosis not present

## 2024-04-02 DIAGNOSIS — R6889 Other general symptoms and signs: Secondary | ICD-10-CM | POA: Diagnosis not present

## 2024-04-02 DIAGNOSIS — M25474 Effusion, right foot: Secondary | ICD-10-CM | POA: Diagnosis not present

## 2024-04-02 DIAGNOSIS — M25472 Effusion, left ankle: Secondary | ICD-10-CM | POA: Diagnosis not present

## 2024-04-14 DIAGNOSIS — I480 Paroxysmal atrial fibrillation: Secondary | ICD-10-CM | POA: Diagnosis not present

## 2024-04-14 DIAGNOSIS — I10 Essential (primary) hypertension: Secondary | ICD-10-CM | POA: Diagnosis not present

## 2024-04-14 DIAGNOSIS — J040 Acute laryngitis: Secondary | ICD-10-CM | POA: Diagnosis not present

## 2024-04-14 DIAGNOSIS — Z299 Encounter for prophylactic measures, unspecified: Secondary | ICD-10-CM | POA: Diagnosis not present

## 2024-04-14 DIAGNOSIS — R059 Cough, unspecified: Secondary | ICD-10-CM | POA: Diagnosis not present
